# Patient Record
Sex: Male | Born: 1970 | Race: White | Hispanic: No | Marital: Single | Smoking: Never smoker
Health system: Southern US, Community
[De-identification: ages and names within clinical notes are randomized; demographics above are authoritative.]

## PROBLEM LIST (undated history)

## (undated) ENCOUNTER — Emergency Department (HOSPITAL_BASED_OUTPATIENT_CLINIC_OR_DEPARTMENT_OTHER)

## (undated) DIAGNOSIS — G8929 Other chronic pain: Secondary | ICD-10-CM

## (undated) DIAGNOSIS — I10 Essential (primary) hypertension: Secondary | ICD-10-CM

## (undated) DIAGNOSIS — J939 Pneumothorax, unspecified: Secondary | ICD-10-CM

## (undated) HISTORY — PX: SHOULDER SURGERY: SHX246

## (undated) HISTORY — DX: Pneumothorax, unspecified: J93.9

## (undated) HISTORY — DX: Other chronic pain: G89.29

## (undated) HISTORY — DX: Essential (primary) hypertension: I10

---

## 2017-10-10 DIAGNOSIS — M79609 Pain in unspecified limb: Secondary | ICD-10-CM

## 2017-10-10 DIAGNOSIS — R29898 Other symptoms and signs involving the musculoskeletal system: Secondary | ICD-10-CM

## 2017-10-10 DIAGNOSIS — G8929 Other chronic pain: Secondary | ICD-10-CM | POA: Insufficient documentation

## 2017-10-10 HISTORY — DX: Other symptoms and signs involving the musculoskeletal system: R29.898

## 2017-10-10 HISTORY — DX: Pain in unspecified limb: M79.609

## 2018-04-15 DIAGNOSIS — E782 Mixed hyperlipidemia: Secondary | ICD-10-CM | POA: Insufficient documentation

## 2018-04-15 DIAGNOSIS — I10 Essential (primary) hypertension: Secondary | ICD-10-CM | POA: Insufficient documentation

## 2018-04-15 HISTORY — DX: Essential (primary) hypertension: I10

## 2018-04-15 HISTORY — DX: Mixed hyperlipidemia: E78.2

## 2018-05-15 DIAGNOSIS — M48061 Spinal stenosis, lumbar region without neurogenic claudication: Secondary | ICD-10-CM

## 2018-05-15 HISTORY — DX: Spinal stenosis, lumbar region without neurogenic claudication: M48.061

## 2018-06-11 DIAGNOSIS — Z6831 Body mass index (BMI) 31.0-31.9, adult: Secondary | ICD-10-CM | POA: Insufficient documentation

## 2018-06-11 HISTORY — DX: Body mass index (BMI) 31.0-31.9, adult: Z68.31

## 2018-07-15 ENCOUNTER — Other Ambulatory Visit: Payer: Self-pay | Admitting: Neurosurgery

## 2018-07-17 ENCOUNTER — Other Ambulatory Visit: Payer: Self-pay | Admitting: *Deleted

## 2018-08-05 ENCOUNTER — Telehealth (HOSPITAL_COMMUNITY): Payer: Self-pay | Admitting: Rehabilitation

## 2018-08-05 NOTE — Telephone Encounter (Signed)

## 2018-08-06 ENCOUNTER — Other Ambulatory Visit: Payer: Self-pay

## 2018-08-06 ENCOUNTER — Ambulatory Visit (INDEPENDENT_AMBULATORY_CARE_PROVIDER_SITE_OTHER): Payer: Medicaid Other | Admitting: Vascular Surgery

## 2018-08-06 ENCOUNTER — Encounter: Payer: Self-pay | Admitting: Vascular Surgery

## 2018-08-06 VITALS — BP 116/76 | HR 86 | Temp 97.3°F | Resp 20 | Ht 67.0 in | Wt 202.5 lb

## 2018-08-06 DIAGNOSIS — M5137 Other intervertebral disc degeneration, lumbosacral region: Secondary | ICD-10-CM

## 2018-08-06 DIAGNOSIS — M5136 Other intervertebral disc degeneration, lumbar region: Secondary | ICD-10-CM

## 2018-08-06 NOTE — Progress Notes (Addendum)
REASON FOR CONSULT:    Preoperative evaluation prior to ALIF.  The consult is requested by Dr. Jordan Likes.  ASSESSMENT & PLAN:   DEGENERATIVE DISC DISEASE L5-S1: Patient appears to be a good candidate for anterior retroperitoneal exposure of L5-S1. I have reviewed our role in exposure of the spine in order to allow anterior lumbar interbody fusion at the appropriate levels. We have discussed the potential complications of surgery, including but not limited to, arterial or venous injury, thrombosis, or bleeding. We have also discussed the potential risks of wound healing problems, the development of a hernia, nerve injury, leg swelling, or other unpredictable medical problems. I've also explained that for the L5-S1 level there is a small risk of retrograde ejaculation. All the patient's questions were answered and they are agreeable to proceed.  Jonathan Ferrari, MD, FACS Beeper [phone] Office: [phone]   HPI:   [name] is a pleasant 48 y.o. male, with a long history of back pain.  This pain is become significantly disabling and is limiting his ability to walk.  He has significant pain in the left leg with burning pain along the lateral aspect of his left thigh.  He is failed conservative treatment and now presents for anterior lumbar interbody fusion with Dr. Jordan Likes.  We have been asked to provide anterior retroperitoneal exposure.  He denies any previous history of abdominal surgery.  His only real risk factor for peripheral vascular disease is a history of hypertension.  He denies any history of claudication, rest pain, or nonhealing ulcers.  He is not a smoker.  I did review the records that were sent from the referring office.  The patient has been followed with back pain.  He had exhausted all efforts at conservative management.  He has a markedly degenerative L5-S1 disc space with disc herniation.  He has radicular symptoms which are causing him to fall.  Past Medical  History:  Diagnosis Date  . Chronic back pain   . Hypertension   . Pneumothorax    right    History reviewed. No pertinent family history.  SOCIAL HISTORY: Social History   Socioeconomic History  . Marital status: Single    Spouse name: Not on file  . Number of children: Not on file  . Years of education: Not on file  . Highest education level: Not on file  Occupational History  . Not on file  Social Needs  . Financial resource strain: Not on file  . Food insecurity    Worry: Not on file    Inability: Not on file  . Transportation needs    Medical: Not on file    Non-medical: Not on file  Tobacco Use  . Smoking status: Never Smoker  . Smokeless tobacco: Never Used  Substance and Sexual Activity  . Alcohol use: Yes    Comment: occasional  . Drug use: Yes    Types: Hydrocodone  . Sexual activity: Not on file  Lifestyle  . Physical activity    Days per week: Not on file    Minutes per session: Not on file  . Stress: Not on file  Relationships  . Social Musician on phone: Not on file    Gets together: Not on file    Attends religious service: Not on file    Active member of club or organization: Not on file    Attends meetings of clubs or organizations: Not on file    Relationship status: Not on  file  . Intimate partner violence    Fear of current or ex partner: Not on file    Emotionally abused: Not on file    Physically abused: Not on file    Forced sexual activity: Not on file  Other Topics Concern  . Not on file  Social History Narrative  . Not on file    No Known Allergies  Current Outpatient Medications  Medication Sig Dispense Refill  . HYDROcodone-acetaminophen (NORCO/VICODIN) 5-325 MG tablet Take 1 tablet by mouth every 6 (six) hours as needed for pain.    . lansoprazole (PREVACID) 15 MG capsule Take 15 mg by mouth every morning.     . lisinopril-hydrochlorothiazide (ZESTORETIC) 20-12.5 MG tablet Take 1 tablet by mouth daily.      No current facility-administered medications for this visit.     REVIEW OF SYSTEMS:  [X] denotes positive finding, [ ] denotes negative finding Cardiac  Comments:  Chest pain or chest pressure:    Shortness of breath upon exertion:    Short of breath when lying flat:    Irregular heart rhythm:        Vascular    Pain in calf, thigh, or hip brought on by ambulation:    Pain in feet at night that wakes you up from your sleep:     Blood clot in your veins:    Leg swelling:         Pulmonary    Oxygen at home:    Productive cough:     Wheezing:         Neurologic    Sudden weakness in arms or legs:     Sudden numbness in arms or legs:     Sudden onset of difficulty speaking or slurred speech:    Temporary loss of vision in one eye:     Problems with dizziness:         Gastrointestinal    Blood in stool:     Vomited blood:         Genitourinary    Burning when urinating:     Blood in urine:        Psychiatric    Major depression:         Hematologic    Bleeding problems:    Problems with blood clotting too easily:        Skin    Rashes or ulcers:        Constitutional    Fever or chills:     PHYSICAL EXAM:   Vitals:   08/06/18 0926  BP: 116/76  Pulse: 86  Resp: 20  Temp: (!) 97.3 F (36.3 C)  SpO2: 97%  Weight: 202 lb 8 oz (91.9 kg)  Height: 5' 7" (1.702 m)    GENERAL: The patient is a well-nourished male, in no acute distress. The vital signs are documented above. CARDIAC: There is a regular rate and rhythm.  VASCULAR: I do not detect carotid bruits. He has palpable femoral and posterior tibial pulses bilaterally. He has no lower extremity swelling. PULMONARY: There is good air exchange bilaterally without wheezing or rales. ABDOMEN: Soft and non-tender with normal pitched bowel sounds.  MUSCULOSKELETAL: There are no major deformities or cyanosis. NEUROLOGIC: He has weakness in the left lower extremity and is difficult for him to even get up on the  table. SKIN: There are no ulcers or rashes noted. PSYCHIATRIC: The patient has a normal affect.  DATA:    CT LUMBAR SPINE: I   did review the images of his CT lumbar spine that was done at [facility].  I do not see any complicating features as related to anterior retroperitoneal exposure of L5-S1.

## 2018-08-06 NOTE — H&P (View-Only) (Signed)
REASON FOR CONSULT:    Preoperative evaluation prior to ALIF.  The consult is requested by Dr. Jordan Likes.  ASSESSMENT & PLAN:   DEGENERATIVE DISC DISEASE L5-S1: Patient appears to be a good candidate for anterior retroperitoneal exposure of L5-S1. I have reviewed our role in exposure of the spine in order to allow anterior lumbar interbody fusion at the appropriate levels. We have discussed the potential complications of surgery, including but not limited to, arterial or venous injury, thrombosis, or bleeding. We have also discussed the potential risks of wound healing problems, the development of a hernia, nerve injury, leg swelling, or other unpredictable medical problems. I've also explained that for the L5-S1 level there is a small risk of retrograde ejaculation. All the patient's questions were answered and they are agreeable to proceed.  Jonathan Ferrari, MD, FACS Beeper [phone] Office: [phone]   HPI:   [name] is a pleasant 48 y.o. male, with a long history of back pain.  This pain is become significantly disabling and is limiting his ability to walk.  He has significant pain in the left leg with burning pain along the lateral aspect of his left thigh.  He is failed conservative treatment and now presents for anterior lumbar interbody fusion with Dr. Jordan Likes.  We have been asked to provide anterior retroperitoneal exposure.  He denies any previous history of abdominal surgery.  His only real risk factor for peripheral vascular disease is a history of hypertension.  He denies any history of claudication, rest pain, or nonhealing ulcers.  He is not a smoker.  I did review the records that were sent from the referring office.  The patient has been followed with back pain.  He had exhausted all efforts at conservative management.  He has a markedly degenerative L5-S1 disc space with disc herniation.  He has radicular symptoms which are causing him to fall.  Past Medical  History:  Diagnosis Date  . Chronic back pain   . Hypertension   . Pneumothorax    right    History reviewed. No pertinent family history.  SOCIAL HISTORY: Social History   Socioeconomic History  . Marital status: Single    Spouse name: Not on file  . Number of children: Not on file  . Years of education: Not on file  . Highest education level: Not on file  Occupational History  . Not on file  Social Needs  . Financial resource strain: Not on file  . Food insecurity    Worry: Not on file    Inability: Not on file  . Transportation needs    Medical: Not on file    Non-medical: Not on file  Tobacco Use  . Smoking status: Never Smoker  . Smokeless tobacco: Never Used  Substance and Sexual Activity  . Alcohol use: Yes    Comment: occasional  . Drug use: Yes    Types: Hydrocodone  . Sexual activity: Not on file  Lifestyle  . Physical activity    Days per week: Not on file    Minutes per session: Not on file  . Stress: Not on file  Relationships  . Social Musician on phone: Not on file    Gets together: Not on file    Attends religious service: Not on file    Active member of club or organization: Not on file    Attends meetings of clubs or organizations: Not on file    Relationship status: Not on  file  . Intimate partner violence    Fear of current or ex partner: Not on file    Emotionally abused: Not on file    Physically abused: Not on file    Forced sexual activity: Not on file  Other Topics Concern  . Not on file  Social History Narrative  . Not on file    No Known Allergies  Current Outpatient Medications  Medication Sig Dispense Refill  . HYDROcodone-acetaminophen (NORCO/VICODIN) 5-325 MG tablet Take 1 tablet by mouth every 6 (six) hours as needed for pain.    Marland Kitchen. lansoprazole (PREVACID) 15 MG capsule Take 15 mg by mouth every morning.     Marland Kitchen. lisinopril-hydrochlorothiazide (ZESTORETIC) 20-12.5 MG tablet Take 1 tablet by mouth daily.      No current facility-administered medications for this visit.     REVIEW OF SYSTEMS:  [X]  denotes positive finding, [ ]  denotes negative finding Cardiac  Comments:  Chest pain or chest pressure:    Shortness of breath upon exertion:    Short of breath when lying flat:    Irregular heart rhythm:        Vascular    Pain in calf, thigh, or hip brought on by ambulation:    Pain in feet at night that wakes you up from your sleep:     Blood clot in your veins:    Leg swelling:         Pulmonary    Oxygen at home:    Productive cough:     Wheezing:         Neurologic    Sudden weakness in arms or legs:     Sudden numbness in arms or legs:     Sudden onset of difficulty speaking or slurred speech:    Temporary loss of vision in one eye:     Problems with dizziness:         Gastrointestinal    Blood in stool:     Vomited blood:         Genitourinary    Burning when urinating:     Blood in urine:        Psychiatric    Major depression:         Hematologic    Bleeding problems:    Problems with blood clotting too easily:        Skin    Rashes or ulcers:        Constitutional    Fever or chills:     PHYSICAL EXAM:   Vitals:   08/06/18 0926  BP: 116/76  Pulse: 86  Resp: 20  Temp: (!) 97.3 F (36.3 C)  SpO2: 97%  Weight: 202 lb 8 oz (91.9 kg)  Height: 5\' 7"  (1.702 m)    GENERAL: The patient is a well-nourished male, in no acute distress. The vital signs are documented above. CARDIAC: There is a regular rate and rhythm.  VASCULAR: I do not detect carotid bruits. He has palpable femoral and posterior tibial pulses bilaterally. He has no lower extremity swelling. PULMONARY: There is good air exchange bilaterally without wheezing or rales. ABDOMEN: Soft and non-tender with normal pitched bowel sounds.  MUSCULOSKELETAL: There are no major deformities or cyanosis. NEUROLOGIC: He has weakness in the left lower extremity and is difficult for him to even get up on the  table. SKIN: There are no ulcers or rashes noted. PSYCHIATRIC: The patient has a normal affect.  DATA:    CT LUMBAR SPINE: I  did review the images of his CT lumbar spine that was done at [facility].  I do not see any complicating features as related to anterior retroperitoneal exposure of L5-S1.

## 2018-08-07 NOTE — Pre-Procedure Instructions (Signed)
[facility], [address] AT [address] [place] [address] Phone: [phone] Fax: [phone]      Your procedure is scheduled on 08-12-18  Report to [facility] Main Entrance "A" at 0530 A.M., and check in at the Admitting office.  Call this number if you have problems the morning of surgery:  [phone]  Call [phone] if you have any questions prior to your surgery date Monday-Friday 8am-4pm    Remember:  Do not eat or drink after midnight the night before your surgery  Take these medicines the morning of surgery with A SIP OF WATER : lansoprazole (PREVACID) HYDROcodone-acetaminophen (NORCO/VICODIN)as needed  7 days prior to surgery STOP taking any Aspirin (unless otherwise instructed by your surgeon), Aleve, Naproxen, Ibuprofen, Motrin, Advil, Goody's, BC's, all herbal medications, fish oil, and all vitamins.    The Morning of Surgery  Do not wear jewelry.  Do not wear lotions, powders, or perfumes/colognes, or deodorant    Men may shave face and neck.  Do not bring valuables to the hospital.  [facility] is not responsible for any belongings or valuables.  If you are a smoker, DO NOT Smoke 24 hours prior to surgery IF you wear a CPAP at night please bring your mask, tubing, and machine the morning of surgery   Remember that you must have someone to transport you home after your surgery, and remain with you for 24 hours if you are discharged the same day.   Contacts, glasses, hearing aids, dentures or bridgework may not be worn into surgery.    Leave your suitcase in the car.  After surgery it may be brought to your room.  For patients admitted to the hospital, discharge time will be determined by your treatment team.  Patients discharged the day of surgery will not be allowed to drive home.    Special instructions:   [facility]- Preparing For Surgery  Before surgery, you  can play an important role. Because skin is not sterile, your skin needs to be as free of germs as possible. You can reduce the number of germs on your skin by washing with CHG (chlorahexidine gluconate) Soap before surgery.  CHG is an antiseptic cleaner which kills germs and bonds with the skin to continue killing germs even after washing.    Oral Hygiene is also important to reduce your risk of infection.  Remember - BRUSH YOUR TEETH THE MORNING OF SURGERY WITH YOUR REGULAR TOOTHPASTE  Please do not use if you have an allergy to CHG or antibacterial soaps. If your skin becomes reddened/irritated stop using the CHG.  Do not shave (including legs and underarms) for at least 48 hours prior to first CHG shower. It is OK to shave your face.  Please follow these instructions carefully.   1. Shower the NIGHT BEFORE SURGERY and the MORNING OF SURGERY with CHG Soap.   2. If you chose to wash your hair, wash your hair first as usual with your normal shampoo.  3. After you shampoo, rinse your hair and body thoroughly to remove the shampoo.  4. Use CHG as you would any other liquid soap. You can apply CHG directly to the skin and wash gently with a scrungie or a clean washcloth.   5. Apply the CHG Soap to your body ONLY FROM THE NECK DOWN.  Do not use on open wounds or open sores. Avoid contact  with your eyes, ears, mouth and genitals (private parts). Wash Face and genitals (private parts)  with your normal soap.   6. Wash thoroughly, paying special attention to the area where your surgery will be performed.  7. Thoroughly rinse your body with warm water from the neck down.  8. DO NOT shower/wash with your normal soap after using and rinsing off the CHG Soap.  9. Pat yourself dry with a CLEAN TOWEL.  10. Wear CLEAN PAJAMAS to bed the night before surgery, wear comfortable clothes the morning of surgery  11. Place CLEAN SHEETS on your bed the night of your first shower and DO NOT SLEEP WITH  PETS.  Day of Surgery:  Do not apply any deodorants/lotions. Please shower the morning of surgery with the CHG soap  Please wear clean clothes to the hospital/surgery center.   Remember to brush your teeth WITH YOUR REGULAR TOOTHPASTE.   Please read over the following fact sheets that you were given.

## 2018-08-08 ENCOUNTER — Encounter (HOSPITAL_COMMUNITY)
Admission: RE | Admit: 2018-08-08 | Discharge: 2018-08-08 | Disposition: A | Discharge status: Home / Self Care | Payer: Medicaid Other | Source: Ambulatory Visit | Attending: Neurosurgery | Admitting: Neurosurgery

## 2018-08-08 ENCOUNTER — Other Ambulatory Visit: Payer: Self-pay

## 2018-08-08 ENCOUNTER — Encounter (HOSPITAL_COMMUNITY): Payer: Self-pay

## 2018-08-08 ENCOUNTER — Other Ambulatory Visit (HOSPITAL_COMMUNITY)
Admission: RE | Admit: 2018-08-08 | Discharge: 2018-08-08 | Disposition: A | Discharge status: Home / Self Care | Payer: Medicaid Other | Source: Ambulatory Visit | Attending: Neurosurgery | Admitting: Neurosurgery

## 2018-08-08 DIAGNOSIS — Z20828 Contact with and (suspected) exposure to other viral communicable diseases: Secondary | ICD-10-CM | POA: Diagnosis not present

## 2018-08-08 DIAGNOSIS — Z01818 Encounter for other preprocedural examination: Secondary | ICD-10-CM | POA: Diagnosis not present

## 2018-08-08 LAB — ABO/RH: ABO/RH(D): B POS

## 2018-08-08 LAB — CBC WITH DIFFERENTIAL/PLATELET
Abs Immature Granulocytes: 0.01 10*3/uL (ref 0.00–0.07)
Basophils Absolute: 0 10*3/uL (ref 0.0–0.1)
Basophils Relative: 1 %
Eosinophils Absolute: 0.1 10*3/uL (ref 0.0–0.5)
Eosinophils Relative: 2 %
HCT: 42.8 % (ref 39.0–52.0)
Hemoglobin: 15.2 g/dL (ref 13.0–17.0)
Immature Granulocytes: 0 %
Lymphocytes Relative: 27 %
Lymphs Abs: 1.2 10*3/uL (ref 0.7–4.0)
MCH: 32.8 pg (ref 26.0–34.0)
MCHC: 35.5 g/dL (ref 30.0–36.0)
MCV: 92.4 fL (ref 80.0–100.0)
Monocytes Absolute: 0.4 10*3/uL (ref 0.1–1.0)
Monocytes Relative: 8 %
Neutro Abs: 2.7 10*3/uL (ref 1.7–7.7)
Neutrophils Relative %: 62 %
Platelets: 227 10*3/uL (ref 150–400)
RBC: 4.63 MIL/uL (ref 4.22–5.81)
RDW: 12.2 % (ref 11.5–15.5)
WBC: 4.3 10*3/uL (ref 4.0–10.5)
nRBC: 0 % (ref 0.0–0.2)

## 2018-08-08 LAB — BASIC METABOLIC PANEL
Anion gap: 12 (ref 5–15)
BUN: 13 mg/dL (ref 6–20)
CO2: 23 mmol/L (ref 22–32)
Calcium: 9.4 mg/dL (ref 8.9–10.3)
Chloride: 101 mmol/L (ref 98–111)
Creatinine, Ser: 1.16 mg/dL (ref 0.61–1.24)
GFR calc Af Amer: >60 mL/min (ref >60)
GFR calc non Af Amer: >60 mL/min (ref >60)
Glucose, Bld: 98 mg/dL (ref 70–99)
Potassium: 3.7 mmol/L (ref 3.5–5.1)
Sodium: 136 mmol/L (ref 135–145)

## 2018-08-08 LAB — TYPE AND SCREEN
ABO/RH(D): B POS
Antibody Screen: NEGATIVE

## 2018-08-08 LAB — SURGICAL PCR SCREEN
MRSA, PCR: NEGATIVE
Staphylococcus aureus: POSITIVE — AB

## 2018-08-08 NOTE — Progress Notes (Signed)
  Coronavirus Screening Scheduled for COVID test today. Have you experienced the following symptoms:  Cough yes/no: No Fever (>100.88F)  yes/no: No Runny nose yes/no: No Sore throat yes/no: No Difficulty breathing/shortness of breath  yes/no: No Have you or a family member traveled in the last 14 days and where? yes/no: No  PCP - Dr Garlon Hatchet  [facility]  Cardiologist - denies  Chest x-ray - NA  EKG - today  Stress Test - denies  ECHO - denies  Cardiac Cath - denies  AICD-denies PM-denies LOOP-denies  Sleep Study - N CPAP - NA  LABS-CBC diff, T/S,BMP  ERAS-NA  Not a diabetic Fasting Blood Sugar -  Checks Blood Sugar _____ times a day  Anesthesia-N  Pt denies having chest pain, sob, or fever at this time. All instructions explained to the pt, with a verbal understanding of the material. Pt agrees to go over the instructions while at home for a better understanding. Pt also instructed to self quarantine after being tested for COVID-19. The opportunity to ask questions was provided.

## 2018-08-09 LAB — SARS CORONAVIRUS 2 (TAT 6-24 HRS): SARS Coronavirus 2: NEGATIVE

## 2018-08-11 ENCOUNTER — Encounter (HOSPITAL_COMMUNITY): Payer: Self-pay | Admitting: Anesthesiology

## 2018-08-11 NOTE — Anesthesia Preprocedure Evaluation (Addendum)
Anesthesia Evaluation  Patient identified by MRN, date of birth, ID band Patient awake    Reviewed: Allergy & Precautions, NPO status , Patient's Chart, lab work & pertinent test results  Airway Mallampati: II  TM Distance: >3 FB Neck ROM: Full    Dental no notable dental hx. (+) Teeth Intact   Pulmonary neg pulmonary ROS,    Pulmonary exam normal breath sounds clear to auscultation       Cardiovascular hypertension, Pt. on medications Normal cardiovascular exam Rhythm:Regular Rate:Normal  EKG 08/08/2018 Normal   Neuro/Psych negative neurological ROS  negative psych ROS   GI/Hepatic Neg liver ROS, GERD  Controlled and Medicated,  Endo/Other  negative endocrine ROS  Renal/GU negative Renal ROS  negative genitourinary   Musculoskeletal DDD L5-S1   Abdominal (+) + obese,   Peds  Hematology negative hematology ROS (+)   Anesthesia Other Findings   Reproductive/Obstetrics                            Anesthesia Physical Anesthesia Plan  ASA: II  Anesthesia Plan: General   Post-op Pain Management:    Induction: Intravenous  PONV Risk Score and Plan: 4 or greater and Scopolamine patch - Pre-op, Midazolam, Dexamethasone, Ondansetron and Treatment may vary due to age or medical condition  Airway Management Planned: Oral ETT  Additional Equipment:   Intra-op Plan:   Post-operative Plan: Extubation in OR  Informed Consent: I have reviewed the patients History and Physical, chart, labs and discussed the procedure including the risks, benefits and alternatives for the proposed anesthesia with the patient or authorized representative who has indicated his/her understanding and acceptance.     Dental advisory given  Plan Discussed with: CRNA and Surgeon  Anesthesia Plan Comments:        Anesthesia Quick Evaluation

## 2018-08-12 ENCOUNTER — Inpatient Hospital Stay (HOSPITAL_COMMUNITY): Payer: Medicaid Other | Admitting: Anesthesiology

## 2018-08-12 ENCOUNTER — Encounter (HOSPITAL_COMMUNITY)
Admission: RE | Disposition: A | Discharge status: Home / Self Care | Payer: Self-pay | Source: Home / Self Care | Attending: Neurosurgery

## 2018-08-12 ENCOUNTER — Inpatient Hospital Stay (HOSPITAL_COMMUNITY): Payer: Medicaid Other

## 2018-08-12 ENCOUNTER — Other Ambulatory Visit: Payer: Self-pay

## 2018-08-12 ENCOUNTER — Encounter (HOSPITAL_COMMUNITY): Payer: Self-pay

## 2018-08-12 ENCOUNTER — Inpatient Hospital Stay (HOSPITAL_COMMUNITY)
Admission: RE | Admit: 2018-08-12 | Discharge: 2018-08-13 | DRG: 460 | Disposition: A | Discharge status: Home / Self Care | Payer: Medicaid Other | Attending: Neurosurgery | Admitting: Neurosurgery

## 2018-08-12 DIAGNOSIS — M4807 Spinal stenosis, lumbosacral region: Secondary | ICD-10-CM | POA: Diagnosis present

## 2018-08-12 DIAGNOSIS — M51379 Other intervertebral disc degeneration, lumbosacral region without mention of lumbar back pain or lower extremity pain: Secondary | ICD-10-CM | POA: Diagnosis present

## 2018-08-12 DIAGNOSIS — I10 Essential (primary) hypertension: Secondary | ICD-10-CM | POA: Diagnosis present

## 2018-08-12 DIAGNOSIS — G8929 Other chronic pain: Secondary | ICD-10-CM | POA: Diagnosis present

## 2018-08-12 DIAGNOSIS — M5137 Other intervertebral disc degeneration, lumbosacral region: Principal | ICD-10-CM | POA: Diagnosis present

## 2018-08-12 DIAGNOSIS — M5117 Intervertebral disc disorders with radiculopathy, lumbosacral region: Secondary | ICD-10-CM | POA: Diagnosis not present

## 2018-08-12 DIAGNOSIS — M4317 Spondylolisthesis, lumbosacral region: Secondary | ICD-10-CM | POA: Diagnosis present

## 2018-08-12 DIAGNOSIS — K219 Gastro-esophageal reflux disease without esophagitis: Secondary | ICD-10-CM | POA: Diagnosis present

## 2018-08-12 DIAGNOSIS — Z419 Encounter for procedure for purposes other than remedying health state, unspecified: Secondary | ICD-10-CM

## 2018-08-12 DIAGNOSIS — Z79899 Other long term (current) drug therapy: Secondary | ICD-10-CM

## 2018-08-12 HISTORY — PX: ANTERIOR LUMBAR FUSION: SHX1170

## 2018-08-12 HISTORY — PX: ABDOMINAL EXPOSURE: SHX5708

## 2018-08-12 HISTORY — DX: Other intervertebral disc degeneration, lumbosacral region without mention of lumbar back pain or lower extremity pain: M51.379

## 2018-08-12 SURGERY — ANTERIOR LUMBAR FUSION 1 LEVEL
Anesthesia: General | Site: Abdomen

## 2018-08-12 MED ORDER — HYDROCHLOROTHIAZIDE 12.5 MG PO CAPS
12.5000 mg | ORAL_CAPSULE | Freq: Every day | ORAL | Status: DC
  Administered 2018-08-13: 09:00:00 12.5 mg via ORAL
  Filled 2018-08-12 (×2): qty 1

## 2018-08-12 MED ORDER — MENTHOL 3 MG MT LOZG
1.0000 | LOZENGE | OROMUCOSAL | Status: DC | PRN
  Filled 2018-08-12: qty 9

## 2018-08-12 MED ORDER — SODIUM CHLORIDE 0.9% FLUSH: 3.0000 mL | INTRAVENOUS | Status: DC | PRN

## 2018-08-12 MED ORDER — CHLORHEXIDINE GLUCONATE 4 % EX LIQD: 60.0000 mL | Freq: Once | CUTANEOUS | Status: DC

## 2018-08-12 MED ORDER — FENTANYL CITRATE (PF) 250 MCG/5ML IJ SOLN
INTRAMUSCULAR | Status: AC
  Filled 2018-08-12: qty 5

## 2018-08-12 MED ORDER — DEXAMETHASONE SODIUM PHOSPHATE 10 MG/ML IJ SOLN
10.0000 mg | Freq: Once | INTRAMUSCULAR | Status: AC
  Administered 2018-08-12: 10 mg via INTRAVENOUS
  Filled 2018-08-12: qty 1

## 2018-08-12 MED ORDER — DIAZEPAM 5 MG PO TABS
5.0000 mg | ORAL_TABLET | Freq: Four times a day (QID) | ORAL | Status: DC | PRN
  Administered 2018-08-12: 10:00:00 10 mg via ORAL
  Administered 2018-08-12 – 2018-08-13 (×3): 5 mg via ORAL
  Filled 2018-08-12 (×2): qty 1
  Filled 2018-08-12: qty 2

## 2018-08-12 MED ORDER — LACTATED RINGERS IV SOLN
INTRAVENOUS | Status: DC | PRN
  Administered 2018-08-12: 07:00:00 via INTRAVENOUS

## 2018-08-12 MED ORDER — HYDROMORPHONE HCL 1 MG/ML IJ SOLN
0.2500 mg | INTRAMUSCULAR | Status: DC | PRN
  Administered 2018-08-12 (×3): 0.5 mg via INTRAVENOUS

## 2018-08-12 MED ORDER — BISACODYL 10 MG RE SUPP: 10.0000 mg | Freq: Every day | RECTAL | Status: DC | PRN

## 2018-08-12 MED ORDER — LIDOCAINE 2% (20 MG/ML) 5 ML SYRINGE
INTRAMUSCULAR | Status: DC | PRN
  Administered 2018-08-12: 80 mg via INTRAVENOUS

## 2018-08-12 MED ORDER — SODIUM CHLORIDE 0.9 % IV SOLN: INTRAVENOUS | Status: DC | PRN

## 2018-08-12 MED ORDER — SUGAMMADEX SODIUM 200 MG/2ML IV SOLN
INTRAVENOUS | Status: DC | PRN
  Administered 2018-08-12: 200 mg via INTRAVENOUS

## 2018-08-12 MED ORDER — THROMBIN 5000 UNITS EX SOLR
CUTANEOUS | Status: AC
  Filled 2018-08-12: qty 5000

## 2018-08-12 MED ORDER — LISINOPRIL 20 MG PO TABS
20.0000 mg | ORAL_TABLET | Freq: Every day | ORAL | Status: DC
  Administered 2018-08-13: 09:00:00 20 mg via ORAL
  Filled 2018-08-12 (×2): qty 1

## 2018-08-12 MED ORDER — MEPERIDINE HCL 25 MG/ML IJ SOLN: 6.2500 mg | INTRAMUSCULAR | Status: DC | PRN

## 2018-08-12 MED ORDER — PROPOFOL 10 MG/ML IV BOLUS
INTRAVENOUS | Status: AC
  Filled 2018-08-12: qty 20

## 2018-08-12 MED ORDER — ONDANSETRON HCL 4 MG/2ML IJ SOLN
INTRAMUSCULAR | Status: DC | PRN
  Administered 2018-08-12: 4 mg via INTRAVENOUS

## 2018-08-12 MED ORDER — THROMBIN 20000 UNITS EX SOLR
CUTANEOUS | Status: DC | PRN
  Administered 2018-08-12: 09:00:00 20 mL via TOPICAL

## 2018-08-12 MED ORDER — MIDAZOLAM HCL 2 MG/2ML IJ SOLN
INTRAMUSCULAR | Status: AC
  Filled 2018-08-12: qty 2

## 2018-08-12 MED ORDER — PHENYLEPHRINE 40 MCG/ML (10ML) SYRINGE FOR IV PUSH (FOR BLOOD PRESSURE SUPPORT)
PREFILLED_SYRINGE | INTRAVENOUS | Status: DC | PRN
  Administered 2018-08-12: 80 ug via INTRAVENOUS

## 2018-08-12 MED ORDER — THROMBIN 20000 UNITS EX SOLR
CUTANEOUS | Status: AC
  Filled 2018-08-12: qty 20000

## 2018-08-12 MED ORDER — ACETAMINOPHEN 650 MG RE SUPP: 650.0000 mg | RECTAL | Status: DC | PRN

## 2018-08-12 MED ORDER — BUPIVACAINE HCL (PF) 0.25 % IJ SOLN
INTRAMUSCULAR | Status: AC
  Filled 2018-08-12: qty 30

## 2018-08-12 MED ORDER — LISINOPRIL-HYDROCHLOROTHIAZIDE 20-12.5 MG PO TABS: 1.0000 | ORAL_TABLET | Freq: Every day | ORAL | Status: DC

## 2018-08-12 MED ORDER — PHENOL 1.4 % MT LIQD: 1.0000 | OROMUCOSAL | Status: DC | PRN

## 2018-08-12 MED ORDER — SCOPOLAMINE 1 MG/3DAYS TD PT72
MEDICATED_PATCH | TRANSDERMAL | Status: DC | PRN
  Administered 2018-08-12: 1 via TRANSDERMAL

## 2018-08-12 MED ORDER — OXYCODONE HCL 5 MG PO TABS
ORAL_TABLET | ORAL | Status: AC
  Filled 2018-08-12: qty 2

## 2018-08-12 MED ORDER — PANTOPRAZOLE SODIUM 20 MG PO TBEC
20.0000 mg | DELAYED_RELEASE_TABLET | Freq: Every day | ORAL | Status: DC
  Administered 2018-08-12 – 2018-08-13 (×2): 20 mg via ORAL
  Filled 2018-08-12 (×2): qty 1

## 2018-08-12 MED ORDER — CEFAZOLIN SODIUM-DEXTROSE 1-4 GM/50ML-% IV SOLN
1.0000 g | Freq: Three times a day (TID) | INTRAVENOUS | Status: AC
  Administered 2018-08-12: 1 g via INTRAVENOUS
  Filled 2018-08-12: qty 50

## 2018-08-12 MED ORDER — ONDANSETRON HCL 4 MG PO TABS: 4.0000 mg | ORAL_TABLET | Freq: Four times a day (QID) | ORAL | Status: DC | PRN

## 2018-08-12 MED ORDER — SUCCINYLCHOLINE CHLORIDE 200 MG/10ML IV SOSY
PREFILLED_SYRINGE | INTRAVENOUS | Status: DC | PRN
  Administered 2018-08-12: 100 mg via INTRAVENOUS

## 2018-08-12 MED ORDER — SODIUM CHLORIDE 0.9 % IV SOLN
250.0000 mL | INTRAVENOUS | Status: DC
  Administered 2018-08-12: 11:00:00 25 mL via INTRAVENOUS

## 2018-08-12 MED ORDER — POLYETHYLENE GLYCOL 3350 17 G PO PACK: 17.0000 g | PACK | Freq: Every day | ORAL | Status: DC | PRN

## 2018-08-12 MED ORDER — HYDROMORPHONE HCL 1 MG/ML IJ SOLN
INTRAMUSCULAR | Status: AC
  Filled 2018-08-12: qty 1

## 2018-08-12 MED ORDER — HYDROCODONE-ACETAMINOPHEN 10-325 MG PO TABS: 1.0000 | ORAL_TABLET | ORAL | Status: DC | PRN

## 2018-08-12 MED ORDER — FLEET ENEMA 7-19 GM/118ML RE ENEM: 1.0000 | ENEMA | Freq: Once | RECTAL | Status: DC | PRN

## 2018-08-12 MED ORDER — SODIUM CHLORIDE 0.9 % IV SOLN
INTRAVENOUS | Status: DC | PRN
  Administered 2018-08-12: 30 ug/min via INTRAVENOUS

## 2018-08-12 MED ORDER — ROCURONIUM BROMIDE 10 MG/ML (PF) SYRINGE
PREFILLED_SYRINGE | INTRAVENOUS | Status: DC | PRN
  Administered 2018-08-12: 20 mg via INTRAVENOUS
  Administered 2018-08-12: 50 mg via INTRAVENOUS

## 2018-08-12 MED ORDER — OXYCODONE HCL 5 MG PO TABS
10.0000 mg | ORAL_TABLET | ORAL | Status: DC | PRN
  Administered 2018-08-12 – 2018-08-13 (×7): 10 mg via ORAL
  Filled 2018-08-12 (×6): qty 2

## 2018-08-12 MED ORDER — FENTANYL CITRATE (PF) 250 MCG/5ML IJ SOLN
INTRAMUSCULAR | Status: DC | PRN
  Administered 2018-08-12 (×3): 50 ug via INTRAVENOUS
  Administered 2018-08-12: 25 ug via INTRAVENOUS
  Administered 2018-08-12: 50 ug via INTRAVENOUS
  Administered 2018-08-12: 25 ug via INTRAVENOUS

## 2018-08-12 MED ORDER — ACETAMINOPHEN 325 MG PO TABS
650.0000 mg | ORAL_TABLET | ORAL | Status: DC | PRN
  Administered 2018-08-12 – 2018-08-13 (×2): 650 mg via ORAL
  Filled 2018-08-12 (×2): qty 2

## 2018-08-12 MED ORDER — SCOPOLAMINE 1 MG/3DAYS TD PT72
MEDICATED_PATCH | TRANSDERMAL | Status: AC
  Filled 2018-08-12: qty 1

## 2018-08-12 MED ORDER — CEFAZOLIN SODIUM-DEXTROSE 2-4 GM/100ML-% IV SOLN
2.0000 g | INTRAVENOUS | Status: AC
  Administered 2018-08-12: 2 g via INTRAVENOUS
  Filled 2018-08-12: qty 100

## 2018-08-12 MED ORDER — PROMETHAZINE HCL 25 MG/ML IJ SOLN: 12.5000 mg | Freq: Once | INTRAMUSCULAR | Status: DC | PRN

## 2018-08-12 MED ORDER — 0.9 % SODIUM CHLORIDE (POUR BTL) OPTIME
TOPICAL | Status: DC | PRN
  Administered 2018-08-12: 09:00:00 1000 mL

## 2018-08-12 MED ORDER — CHLORHEXIDINE GLUCONATE CLOTH 2 % EX PADS: 6.0000 | MEDICATED_PAD | Freq: Once | CUTANEOUS | Status: DC

## 2018-08-12 MED ORDER — HYDROMORPHONE HCL 1 MG/ML IJ SOLN
INTRAMUSCULAR | Status: AC
  Filled 2018-08-12: qty 2

## 2018-08-12 MED ORDER — DIAZEPAM 5 MG PO TABS
ORAL_TABLET | ORAL | Status: AC
  Filled 2018-08-12: qty 2

## 2018-08-12 MED ORDER — SODIUM CHLORIDE 0.9% FLUSH
3.0000 mL | Freq: Two times a day (BID) | INTRAVENOUS | Status: DC
  Administered 2018-08-12: 3 mL via INTRAVENOUS

## 2018-08-12 MED ORDER — HYDROCODONE-ACETAMINOPHEN 5-325 MG PO TABS: 1.0000 | ORAL_TABLET | Freq: Four times a day (QID) | ORAL | Status: DC | PRN

## 2018-08-12 MED ORDER — HYDROMORPHONE HCL 1 MG/ML IJ SOLN
1.0000 mg | INTRAMUSCULAR | Status: DC | PRN
  Administered 2018-08-12: 12:00:00 1 mg via INTRAVENOUS

## 2018-08-12 MED ORDER — ONDANSETRON HCL 4 MG/2ML IJ SOLN
4.0000 mg | Freq: Four times a day (QID) | INTRAMUSCULAR | Status: DC | PRN
  Administered 2018-08-12: 4 mg via INTRAVENOUS

## 2018-08-12 MED ORDER — ONDANSETRON HCL 4 MG/2ML IJ SOLN
INTRAMUSCULAR | Status: AC
  Filled 2018-08-12: qty 2

## 2018-08-12 MED ORDER — PROPOFOL 10 MG/ML IV BOLUS
INTRAVENOUS | Status: DC | PRN
  Administered 2018-08-12: 130 mg via INTRAVENOUS
  Administered 2018-08-12: 70 mg via INTRAVENOUS

## 2018-08-12 MED ORDER — SODIUM CHLORIDE 0.9 % IV SOLN
INTRAVENOUS | Status: DC | PRN
  Administered 2018-08-12: 500 mL

## 2018-08-12 MED ORDER — MIDAZOLAM HCL 5 MG/5ML IJ SOLN
INTRAMUSCULAR | Status: DC | PRN
  Administered 2018-08-12: 2 mg via INTRAVENOUS

## 2018-08-12 MED ORDER — BUPIVACAINE HCL (PF) 0.25 % IJ SOLN
INTRAMUSCULAR | Status: DC | PRN
  Administered 2018-08-12: 20 mL

## 2018-08-12 SURGICAL SUPPLY — 97 items
ANCHOR LUMBAR 27 (Anchor) ×6 IMPLANT
ANCHOR LUMBAR 27MM (Anchor) ×3 IMPLANT
APPLIER CLIP 11 MED OPEN (CLIP) ×3
BAG DECANTER FOR FLEXI CONT (MISCELLANEOUS) ×3 IMPLANT
BENZOIN TINCTURE PRP APPL 2/3 (GAUZE/BANDAGES/DRESSINGS) ×3 IMPLANT
BUR MATCHSTICK NEURO 3.0 LAGG (BURR) IMPLANT
CANISTER SUCT 3000ML PPV (MISCELLANEOUS) ×3 IMPLANT
CARTRIDGE OIL MAESTRO DRILL (MISCELLANEOUS) ×1 IMPLANT
CLIP APPLIE 11 MED OPEN (CLIP) ×1 IMPLANT
CLOSURE STERI-STRIP 1/2X4 (GAUZE/BANDAGES/DRESSINGS) ×1
CLOSURE WOUND 1/2 X4 (GAUZE/BANDAGES/DRESSINGS) ×1
CLSR STERI-STRIP ANTIMIC 1/2X4 (GAUZE/BANDAGES/DRESSINGS) ×2 IMPLANT
COVER BACK TABLE 60X90IN (DRAPES) ×3 IMPLANT
COVER WAND RF STERILE (DRAPES) ×3 IMPLANT
DERMABOND ADVANCED (GAUZE/BANDAGES/DRESSINGS) ×2
DERMABOND ADVANCED .7 DNX12 (GAUZE/BANDAGES/DRESSINGS) ×1 IMPLANT
DIFFUSER DRILL AIR PNEUMATIC (MISCELLANEOUS) ×3 IMPLANT
DRAPE C-ARM 42X72 X-RAY (DRAPES) ×6 IMPLANT
DRAPE INCISE IOBAN 66X45 STRL (DRAPES) ×3 IMPLANT
DRAPE LAPAROTOMY 100X72X124 (DRAPES) ×3 IMPLANT
DRSG OPSITE POSTOP 4X8 (GAUZE/BANDAGES/DRESSINGS) ×3 IMPLANT
ELECT BLADE 4.0 EZ CLEAN MEGAD (MISCELLANEOUS) ×3
ELECT BLADE 6.5 EXT (BLADE) ×3 IMPLANT
ELECT REM PT RETURN 9FT ADLT (ELECTROSURGICAL) ×3
ELECTRODE BLDE 4.0 EZ CLN MEGD (MISCELLANEOUS) ×1 IMPLANT
ELECTRODE REM PT RTRN 9FT ADLT (ELECTROSURGICAL) ×1 IMPLANT
GAUZE 4X4 16PLY RFD (DISPOSABLE) IMPLANT
GAUZE SPONGE 4X4 12PLY STRL (GAUZE/BANDAGES/DRESSINGS) IMPLANT
GLOVE BIO SURGEON STRL SZ 6.5 (GLOVE) ×2 IMPLANT
GLOVE BIO SURGEON STRL SZ7.5 (GLOVE) ×6 IMPLANT
GLOVE BIO SURGEONS STRL SZ 6.5 (GLOVE) ×1
GLOVE BIOGEL PI IND STRL 6.5 (GLOVE) ×1 IMPLANT
GLOVE BIOGEL PI IND STRL 7.0 (GLOVE) ×2 IMPLANT
GLOVE BIOGEL PI IND STRL 7.5 (GLOVE) ×2 IMPLANT
GLOVE BIOGEL PI IND STRL 8 (GLOVE) ×2 IMPLANT
GLOVE BIOGEL PI INDICATOR 6.5 (GLOVE) ×2
GLOVE BIOGEL PI INDICATOR 7.0 (GLOVE) ×4
GLOVE BIOGEL PI INDICATOR 7.5 (GLOVE) ×4
GLOVE BIOGEL PI INDICATOR 8 (GLOVE) ×4
GLOVE ECLIPSE 7.5 STRL STRAW (GLOVE) ×3 IMPLANT
GLOVE ECLIPSE 9.0 STRL (GLOVE) ×3 IMPLANT
GLOVE EXAM NITRILE XL STR (GLOVE) IMPLANT
GLOVE SURG SS PI 7.5 STRL IVOR (GLOVE) ×12 IMPLANT
GOWN STRL REUS W/ TWL LRG LVL3 (GOWN DISPOSABLE) ×3 IMPLANT
GOWN STRL REUS W/ TWL XL LVL3 (GOWN DISPOSABLE) ×3 IMPLANT
GOWN STRL REUS W/TWL 2XL LVL3 (GOWN DISPOSABLE) IMPLANT
GOWN STRL REUS W/TWL LRG LVL3 (GOWN DISPOSABLE) ×6
GOWN STRL REUS W/TWL XL LVL3 (GOWN DISPOSABLE) ×6
INSERT FOGARTY 61MM (MISCELLANEOUS) IMPLANT
INSERT FOGARTY SM (MISCELLANEOUS) IMPLANT
KIT BASIN OR (CUSTOM PROCEDURE TRAY) ×3 IMPLANT
KIT INFUSE X SMALL 1.4CC (Orthopedic Implant) ×3 IMPLANT
KIT TURNOVER KIT B (KITS) ×3 IMPLANT
LOOP VESSEL MAXI BLUE (MISCELLANEOUS) IMPLANT
LOOP VESSEL MINI RED (MISCELLANEOUS) IMPLANT
NEEDLE HYPO 22GX1.5 SAFETY (NEEDLE) ×6 IMPLANT
NEEDLE SPNL 18GX3.5 QUINCKE PK (NEEDLE) IMPLANT
NS IRRIG 1000ML POUR BTL (IV SOLUTION) ×3 IMPLANT
OIL CARTRIDGE MAESTRO DRILL (MISCELLANEOUS) ×3
PACK LAMINECTOMY NEURO (CUSTOM PROCEDURE TRAY) ×3 IMPLANT
PAD ARMBOARD 7.5X6 YLW CONV (MISCELLANEOUS) ×9 IMPLANT
PUTTY BONE DBX 5CC MIX (Putty) ×3 IMPLANT
SPACER HEDRON IA 29X39 13 15D (Spacer) ×3 IMPLANT
SPONGE INTESTINAL PEANUT (DISPOSABLE) ×6 IMPLANT
SPONGE LAP 18X18 RF (DISPOSABLE) ×3 IMPLANT
SPONGE LAP 4X18 RFD (DISPOSABLE) IMPLANT
SPONGE SURGIFOAM ABS GEL 100 (HEMOSTASIS) ×3 IMPLANT
STAPLER VISISTAT (STAPLE) IMPLANT
STAPLER VISISTAT 35W (STAPLE) ×3 IMPLANT
STRIP CLOSURE SKIN 1/2X4 (GAUZE/BANDAGES/DRESSINGS) ×2 IMPLANT
SUT PDS AB 1 CTX 36 (SUTURE) ×3 IMPLANT
SUT PROLENE 4 0 RB 1 (SUTURE)
SUT PROLENE 4-0 RB1 .5 CRCL 36 (SUTURE) IMPLANT
SUT PROLENE 5 0 CC1 (SUTURE) IMPLANT
SUT PROLENE 6 0 C 1 30 (SUTURE) IMPLANT
SUT PROLENE 6 0 CC (SUTURE) IMPLANT
SUT SILK 0 TIES 10X30 (SUTURE) IMPLANT
SUT SILK 2 0 TIES 10X30 (SUTURE) ×3 IMPLANT
SUT SILK 2 0SH CR/8 30 (SUTURE) IMPLANT
SUT SILK 3 0 SH CR/8 (SUTURE) IMPLANT
SUT SILK 3 0 TIES 10X30 (SUTURE) IMPLANT
SUT SILK 3 0SH CR/8 30 (SUTURE) IMPLANT
SUT VIC AB 0 CT1 18XCR BRD8 (SUTURE) IMPLANT
SUT VIC AB 0 CT1 27 (SUTURE)
SUT VIC AB 0 CT1 27XBRD ANBCTR (SUTURE) IMPLANT
SUT VIC AB 0 CT1 27XBRD ANTBC (SUTURE) IMPLANT
SUT VIC AB 0 CT1 8-18 (SUTURE)
SUT VIC AB 2-0 CT1 18 (SUTURE) ×3 IMPLANT
SUT VIC AB 2-0 CTB1 (SUTURE) IMPLANT
SUT VIC AB 3-0 SH 27 (SUTURE)
SUT VIC AB 3-0 SH 27X BRD (SUTURE) IMPLANT
SUT VIC AB 3-0 SH 8-18 (SUTURE) ×6 IMPLANT
SUT VICRYL 4-0 PS2 18IN ABS (SUTURE) IMPLANT
TOWEL GREEN STERILE (TOWEL DISPOSABLE) ×3 IMPLANT
TOWEL GREEN STERILE FF (TOWEL DISPOSABLE) ×3 IMPLANT
TRAY FOLEY MTR SLVR 16FR STAT (SET/KITS/TRAYS/PACK) ×3 IMPLANT
WATER STERILE IRR 1000ML POUR (IV SOLUTION) ×3 IMPLANT

## 2018-08-12 NOTE — Interval H&P Note (Signed)
History and Physical Interval Note:  08/12/2018 7:18 AM  [name]  has presented today for surgery, with the diagnosis of degenerative disc disease.  The various methods of treatment have been discussed with the patient and family. After consideration of risks, benefits and other options for treatment, the patient has consented to  Procedure(s): ALIF - L5-S1 (N/A) ABDOMINAL EXPOSURE (N/A) as a surgical intervention.  The patient's history has been reviewed, patient examined, no change in status, stable for surgery.  I have reviewed the patient's chart and labs.  Questions were answered to the patient's satisfaction.     Deitra Mayo

## 2018-08-12 NOTE — Brief Op Note (Signed)
08/12/2018  9:39 AM  PATIENT:  [name]  48 y.o. male  PRE-OPERATIVE DIAGNOSIS:  degenerative disc disease  POST-OPERATIVE DIAGNOSIS:  degenerative disc disease  PROCEDURE:  Procedure(s): ANTERIOR LUMBAR INTERBODY FUSION-LUMBAR FIVE-SACRAL ONE (N/A) ABDOMINAL EXPOSURE (N/A)  SURGEON:  Surgeon(s) and Role: Panel 1:    * Earnie Larsson, MD - Primary Panel 2:    * Angelia Mould, MD - Primary  PHYSICIAN ASSISTANT:   ASSISTANTSMearl Latin   ANESTHESIA:   general  EBL:  100 mL   BLOOD ADMINISTERED:none  DRAINS: none   LOCAL MEDICATIONS USED:  MARCAINE     SPECIMEN:  No Specimen  DISPOSITION OF SPECIMEN:  N/A  COUNTS:  YES  TOURNIQUET:  * No tourniquets in log *  DICTATION: .Dragon Dictation  PLAN OF CARE: Admit to inpatient   PATIENT DISPOSITION:  PACU - hemodynamically stable.   Delay start of Pharmacological VTE agent (>24hrs) due to surgical blood loss or risk of bleeding: yes

## 2018-08-12 NOTE — Anesthesia Postprocedure Evaluation (Signed)
Anesthesia Post Note  Patient: [name]  Procedure(s) Performed: ANTERIOR LUMBAR INTERBODY FUSION-LUMBAR FIVE-SACRAL ONE (N/A Abdomen) ABDOMINAL EXPOSURE (N/A Abdomen)     Patient location during evaluation: PACU Anesthesia Type: General Level of consciousness: awake and alert and oriented Pain management: pain level controlled Vital Signs Assessment: post-procedure vital signs reviewed and stable Respiratory status: spontaneous breathing, nonlabored ventilation, respiratory function stable and patient connected to nasal cannula oxygen Cardiovascular status: blood pressure returned to baseline and stable Postop Assessment: no apparent nausea or vomiting Anesthetic complications: no    Last Vitals:  Vitals:   08/12/18 1100 08/12/18 1110  BP: 114/80   Pulse: 98   Resp: 15 16  Temp:  (!) 36.1 C  SpO2: 91%     Last Pain:  Vitals:   08/12/18 1110  TempSrc:   PainSc: 5     LLE Motor Response: Purposeful movement;Responds to commands (08/12/18 1110) LLE Sensation: Full sensation (08/12/18 1110) RLE Motor Response: Purposeful movement;Responds to commands (08/12/18 1110) RLE Sensation: Full sensation (08/12/18 1110)      Ronae Noell A.

## 2018-08-12 NOTE — Op Note (Signed)
Date of procedure: 08/12/2018  Date of dictation: Same  Service: Neurosurgery  Preoperative diagnosis: L5-S1 degenerative disc disease with mild retrolisthesis and foraminal stenosis.  Postoperative diagnosis: Same  Procedure Name: L5-S1 anterior lumbar interbody fusion utilizing interbody titanium cage, morselized allograft, bone morphogenic protein, and anterior plate instrumentation.  Surgeon:Dailan Pfalzgraf A.Anthonyjames Bargar, M.D.  Asst. Surgeon: Reinaldo Meeker, NP  Vascular surgeon: Scot Dock  Anesthesia: General  Indication: 48 year old male with chronic intractable back pain failing all of his conservative management.  Work-up demonstrates evidence of marked disc degeneration with disc space collapse and broad-based disc protrusion with significant foraminal stenosis.  Patient presents now for anterior lumbar interbody fusion in hopes of improving his symptoms.  Operative note: After induction of anesthesia, patient position supine with arms outstretched.  Patient's lower abdomen was prepped and draped sterilely.  Localizing fluoroscopy was used.  Dr. Scot Dock then performed an incision in the left lower quadrant.  Dissection was then made down to the rectus sheath.  Rectus sheath was divided.  Rectus muscle was mobilized.  Working lateral to the rectus muscle on the left side the retroperitoneal space was entered.  Using a mixture of blunt and sharp dissection the sacral promontory was identified as were the iliac vessels.  Retractors were placed bilaterally.  Middle sacral vessels were coagulated and cut.  Localizing x-rays confirmed the L5-S1 interspace and marked the midline.  I then performed a discectomy by first incising the disc and then removing disc material using various curettes and Kerrison rongeurs down to level the posterior logical ligament.  Posterior ligament was entered and the underlying dura was visualized.  There was no evidence of injury to the thecal sac or nerve roots.  Decompression then  proceeded to each neural foramina.  Exiting L5 nerve roots were identified.  The to space was then irrigated.  The disc base was incised.  A globus medical head drawn cage sized 13 mm high by 15 degrees, large was then packed with morselized DBX putty and bone morphogenic protein sponges.  This was then impacted in the place under fluoroscopic guidance.  After the cage was well positioned locking anchors were then impacted into L5 and S1.  A locking anchor was placed into L5 in the midline into locking anchors were placed into the sacrum.  Final images reveal good position the cage and the hardware at the proper operative level with normal alignment of spine.  Wound is irrigated one final time.  Then closed in a typical fashion.  Steri-Strips and sterile dressing were applied.  No apparent complications.  Patient tolerated the procedure well and he returns to the recovery room postop.

## 2018-08-12 NOTE — Transfer of Care (Signed)
Immediate Anesthesia Transfer of Care Note  Patient: [name]  Procedure(s) Performed: ANTERIOR LUMBAR INTERBODY FUSION-LUMBAR FIVE-SACRAL ONE (N/A Abdomen) ABDOMINAL EXPOSURE (N/A Abdomen)  Patient Location: PACU  Anesthesia Type:General  Level of Consciousness: awake, alert  and oriented  Airway & Oxygen Therapy: Patient Spontanous Breathing and Patient connected to nasal cannula oxygen  Post-op Assessment: Report given to RN and Post -op Vital signs reviewed and stable  Post vital signs: Reviewed and stable ABP 133/57  Last Vitals:  Vitals Value Taken Time  BP 151/127 08/12/18 1012  Temp    Pulse 105 08/12/18 1015  Resp 16 08/12/18 1015  SpO2 97 % 08/12/18 1015  Vitals shown include unvalidated device data.  Last Pain:  Vitals:   08/12/18 0639  TempSrc:   PainSc: 5       Patients Stated Pain Goal: 3 (76/19/50 9326)  Complications: No apparent anesthesia complications

## 2018-08-12 NOTE — Anesthesia Procedure Notes (Signed)
Arterial Line Insertion Start/End8/04/2018 7:00 AM, 08/12/2018 7:05 AM Performed by: Josephine Igo, MD, Marsa Aris, CRNA, CRNA  Patient location: Pre-op. Preanesthetic checklist: patient identified, IV checked, site marked, risks and benefits discussed, surgical consent, monitors and equipment checked, pre-op evaluation, timeout performed and anesthesia consent Lidocaine 1% used for infiltration Left, radial was placed Catheter size: 20 Fr Hand hygiene performed , maximum sterile barriers used  and Seldinger technique used Allen's test indicative of satisfactory collateral circulation Attempts: 1 Procedure performed without using ultrasound guided technique. Following insertion, dressing applied. Post procedure assessment: normal and unchanged  Patient tolerated the procedure well with no immediate complications.

## 2018-08-12 NOTE — H&P (Signed)
  [name] is an 48 y.o. male.   Chief Complaint: Back pain HPI: 48 year old male with chronic and progressive lower back pain with intermittent radicular symptoms.'s back pain is failed conservative management.  Work-up demonstrates evidence of marked disc space collapse with Modic endplate changes and moderately severe foraminal stenosis.  Patient presents now for lumbar decompression and fusion L5-S1.  Past Medical History:  Diagnosis Date  . Chronic back pain   . Hypertension   . Pneumothorax    right    Past Surgical History:  Procedure Laterality Date  . SHOULDER SURGERY Right     History reviewed. No pertinent family history. Social History:  reports that he has never smoked. He has never used smokeless tobacco. He reports current alcohol use. He reports current drug use. Drug: Hydrocodone.  Allergies: No Known Allergies  Medications Prior to Admission  Medication Sig Dispense Refill  . HYDROcodone-acetaminophen (NORCO/VICODIN) 5-325 MG tablet Take 1 tablet by mouth every 6 (six) hours as needed for pain.    Marland Kitchen lansoprazole (PREVACID) 15 MG capsule Take 15 mg by mouth every morning.     Marland Kitchen lisinopril-hydrochlorothiazide (ZESTORETIC) 20-12.5 MG tablet Take 1 tablet by mouth daily.      No results found for this or any previous visit (from the past 48 hour(s)). No results found.  Pertinent items noted in HPI and remainder of comprehensive ROS otherwise negative.  Blood pressure (!) 151/95, pulse 78, temperature 98.3 F (36.8 C), temperature source Oral, resp. rate 18, height 5\' 7"  (1.702 m), weight 91.4 kg, SpO2 99 %.  Patient is awake and alert.  He is oriented and appropriate.  Speech is fluent.  Judgment insight are intact.  Cranial nerve function normal bilateral.  Motor examination intact bilateral presents examination with some mild decrease sensation pinprick and light touch in his L5 dermatomes bilateral.  Deep tender versus normal active step his Achilles  reflexes are hypoactive.  No evidence of long track signs.  Gait is antalgic.  Posture is mildly flexed per examination head ears eyes nose throat is unremarkable her chest and abdomen are benign.  Extremities are free from injury or deformity. Assessment/Plan L5-S1 degenerative disc disease with foraminal stenosis.  Plan anterior lumbar interbody fusion utilizing interbody cage, anterior plate fixation, and morselized allograft with bone morphogenic protein.  Risks and benefits of been explained.  Patient wishes to proceed.  Satanta A Beadie Matsunaga 08/12/2018, 7:40 AM

## 2018-08-12 NOTE — Anesthesia Procedure Notes (Addendum)
Procedure Name: Intubation Date/Time: 08/12/2018 8:00 AM Performed by: Josephine Igo, MD Pre-anesthesia Checklist: Patient identified, Emergency Drugs available, Suction available, Patient being monitored and Timeout performed Patient Re-evaluated:Patient Re-evaluated prior to induction Oxygen Delivery Method: Circle system utilized Preoxygenation: Pre-oxygenation with 100% oxygen Induction Type: IV induction Laryngoscope Size: Mac and 3 Grade View: Grade II Tube type: Oral Tube size: 7.5 mm Number of attempts: 1 Airway Equipment and Method: Stylet and Oral airway Placement Confirmation: ETT inserted through vocal cords under direct vision,  positive ETCO2 and breath sounds checked- equal and bilateral Secured at: 22 cm Tube secured with: Tape Dental Injury: Teeth and Oropharynx as per pre-operative assessment

## 2018-08-12 NOTE — Op Note (Signed)
    NAME: [name]    MRN: [id number] DOB: 11-15-1970    DATE OF OPERATION: 08/12/2018  PREOP DIAGNOSIS:    Degenerative disc disease L5-S1  POSTOP DIAGNOSIS:    Same  PROCEDURE:    Anterior retroperitoneal exposure L5-S1  EXPOSURE SURGEON: Judeth Cornfield. Scot Dock, MD  SPINE SURGEON: ASSIST: Earnie Larsson, MD  ANESTHESIA: General  EBL: Minimal  INDICATIONS:    [name] is a 48 y.o. male who presents for anterior lumbar interbody fusion of L5-S1.  We were asked to provide anterior retroperitoneal exposure.  FINDINGS:   The iliac vessels were soft without significant disease.  TECHNIQUE:   The patient was taken to the operating room and received a general anesthetic.  Under fluoroscopy the level of the L5-S1 disc space was marked.  Of note this was fairly low so the incision was going to have to be made not to too far above the pubis.  The abdomen was prepped and draped in usual sterile fashion.  Transverses incision was made to the left of the midline at the marked level.  The dissection was carried down to the anterior rectus sheath.  This was opened transversely.  This was mobilized superiorly and inferiorly.  The rectus abdominis muscle was mobilized medially and the retroperitoneal space entered.  The dissection was carried down to the iliac vessels which were soft.  The dissection was extended bluntly up to the sacral promontory.  The middle sacral vessels were divided using electrocautery.  The L5-S1 disc space was exposed and the Thompson retractor system placed.  The reverse lip retractor blades were placed exposing the L5-S1 disc space.  The remainder of the dictation is as per Dr. Annette Stable.  Deitra Mayo, MD, FACS [facility]  DATE OF DICTATION:   08/12/2018

## 2018-08-13 ENCOUNTER — Encounter (HOSPITAL_COMMUNITY): Payer: Self-pay | Admitting: Neurosurgery

## 2018-08-13 MED ORDER — OXYCODONE HCL 10 MG PO TABS: 10.0000 mg | ORAL_TABLET | ORAL | 0 refills | Status: DC | PRN

## 2018-08-13 MED ORDER — DIAZEPAM 5 MG PO TABS: 5.0000 mg | ORAL_TABLET | Freq: Four times a day (QID) | ORAL | 0 refills | Status: DC | PRN

## 2018-08-13 NOTE — Progress Notes (Signed)
   VASCULAR SURGERY ASSESSMENT & PLAN:   1 Day Post-Op s/p: L5-S1 ALIF. Doing well.  Vascular will be available as needed.    SUBJECTIVE:   Pain well controlled  PHYSICAL EXAM:   Vitals:   08/12/18 1623 08/12/18 1930 08/12/18 2300 08/13/18 0430  BP: (!) 130/92 133/65 100/62 102/67  Pulse: 91 77 90 73  Resp: 18 20 20 20   Temp: (!) 97.1 F (36.2 C) 98.3 F (36.8 C) 98.2 F (36.8 C) (!) 97.5 F (36.4 C)  TempSrc:  Oral Oral Oral  SpO2: 98% 99% 98% 96%  Weight:      Height:       Palpable DP pulse on left.   LABS:    PROBLEM LIST:    Active Problems:   DDD (degenerative disc disease), lumbosacral  CURRENT MEDS:   . hydrochlorothiazide  12.5 mg Oral Daily  . lisinopril  20 mg Oral Daily  . pantoprazole  20 mg Oral Daily  . sodium chloride flush  3 mL Intravenous Q12H    Deitra Mayo Beeper: [phone] Office: [phone] 08/13/2018

## 2018-08-13 NOTE — Evaluation (Signed)
Physical Therapy Evaluation Patient Details Name: [name] MRN: [id number] DOB: [birth year] Today's Date: 08/13/2018   History of Present Illness  Pt is a 48 y/o male now s/p ALIF L5-S1. PMHx includes HTN, pneumothorax, R shoulder surgery   Clinical Impression  Pt admitted with above diagnosis. Pt currently with functional limitations due to the deficits listed below (see PT Problem List). At the time of PT eval pt was able to perform transfers and ambulation with gross min guard assist for balance support and safety. Pt limited this session by pain and shivering. Reports he has been ambulating well in the hallway and RN attested that he was stand by assist without an AD throughout the night/this morning. Will continue to follow and progress as able per POC. Pt will benefit from skilled PT to increase their independence and safety with mobility to allow discharge to the venue listed below.       Follow Up Recommendations No PT follow up;Supervision for mobility/OOB    Equipment Recommendations  Rolling walker with 5" wheels    Recommendations for Other Services       Precautions / Restrictions Precautions Precautions: Fall;Back Precaution Booklet Issued: Yes (comment) Precaution Comments: issued and reviewed with pt  Required Braces or Orthoses: Spinal Brace Spinal Brace: Lumbar corset;Applied in sitting position Restrictions Weight Bearing Restrictions: No      Mobility  Bed Mobility Overal bed mobility: Needs Assistance Bed Mobility: Rolling;Sidelying to Sit;Sit to Sidelying Rolling: Min guard Sidelying to sit: Min guard     Sit to sidelying: Min guard General bed mobility comments: use of bedrail; increased time/effort and cues to utilize log roll technique  Transfers Overall transfer level: Needs assistance Equipment used: Rolling walker (2 wheeled) Transfers: Sit to/from Stand Sit to Stand: Min assist;Min guard         General transfer comment: No assist  required to power-up to full stand, however pt did require min assist to control descent back to EOB with stand>sit.   Ambulation/Gait Ambulation/Gait assistance: Min guard Gait Distance (Feet): 75 Feet Assistive device: Rolling walker (2 wheeled) Gait Pattern/deviations: Step-to pattern;Decreased stride length;Trunk flexed Gait velocity: Decreased   General Gait Details: VC's for improved posture and sequencing with the RW. Pt with difficulty bearing weight through LLE and performing swing through on L side. Limited by shivering  Stairs            Wheelchair Mobility    Modified Rankin (Stroke Patients Only)       Balance Overall balance assessment: Needs assistance Sitting-balance support: Feet supported Sitting balance-Leahy Scale: Fair     Standing balance support: Bilateral upper extremity supported Standing balance-Leahy Scale: Poor Standing balance comment: heavy reliance on UE support                             Pertinent Vitals/Pain Pain Assessment: Faces Faces Pain Scale: Hurts whole lot Pain Location: back/incisional down into LLE Pain Descriptors / Indicators: Discomfort;Guarding;Sharp;Shooting Pain Intervention(s): Limited activity within patient's tolerance;Monitored during session;Repositioned    Home Living Family/patient expects to be discharged to:: Private residence Living Arrangements: Spouse/significant other(fiance) Available Help at Discharge: Family Type of Home: House Home Access: Stairs to enter Entrance Stairs-Rails: None Entrance Stairs-Number of Steps: 2 Home Layout: One level Home Equipment: None Additional Comments: pt reports will have assist from fiance' who is a Marine scientist     Prior Function Level of Independence: Independent  Hand Dominance        Extremity/Trunk Assessment   Upper Extremity Assessment Upper Extremity Assessment: Defer to OT evaluation    Lower Extremity Assessment Lower  Extremity Assessment: RLE deficits/detail;LLE deficits/detail RLE Deficits / Details: Pt reports tremors in RLE and noted he had difficulty controlling RLE with initial stand 2 tremoring. Pt shaking all over, stating he has the chills.  LLE Deficits / Details: Pt reports burning in LLE that has been present since before surgery    Cervical / Trunk Assessment Cervical / Trunk Assessment: Other exceptions Cervical / Trunk Exceptions: s/p spinal surgery  Communication   Communication: No difficulties  Cognition Arousal/Alertness: Awake/alert Behavior During Therapy: WFL for tasks assessed/performed Overall Cognitive Status: Within Functional Limits for tasks assessed                                        General Comments General comments (skin integrity, edema, etc.): pt with significant shivering/trembling with mobility this session; vitals taken after return to supine in bed including O2 sats and temperature and both WNL    Exercises Other Exercises Other Exercises: issued IS and educated in use; pt return demonstrating with good carryover    Assessment/Plan    PT Assessment Patient needs continued PT services  PT Problem List Decreased strength;Decreased activity tolerance;Decreased balance;Decreased mobility;Decreased safety awareness;Decreased knowledge of use of DME;Decreased knowledge of precautions;Pain       PT Treatment Interventions DME instruction;Gait training;Functional mobility training;Therapeutic activities;Therapeutic exercise;Neuromuscular re-education;Patient/family education    PT Goals (Current goals can be found in the Care Plan section)  Acute Rehab PT Goals Patient Stated Goal: home when able PT Goal Formulation: With patient Time For Goal Achievement: 08/20/18 Potential to Achieve Goals: Good    Frequency Min 5X/week   Barriers to discharge        Co-evaluation               AM-PAC PT "6 Clicks" Mobility  Outcome Measure  Help needed turning from your back to your side while in a flat bed without using bedrails?: None Help needed moving from lying on your back to sitting on the side of a flat bed without using bedrails?: None Help needed moving to and from a bed to a chair (including a wheelchair)?: A Little Help needed standing up from a chair using your arms (e.g., wheelchair or bedside chair)?: A Little Help needed to walk in hospital room?: A Little Help needed climbing 3-5 steps with a railing? : A Little 6 Click Score: 20    End of Session Equipment Utilized During Treatment: Gait belt Activity Tolerance: Patient tolerated treatment well Patient left: in bed Nurse Communication: Mobility status PT Visit Diagnosis: Unsteadiness on feet (R26.81)    Time: 3536-1443 PT Time Calculation (min) (ACUTE ONLY): 31 min   Charges:   PT Evaluation $PT Eval Moderate Complexity: 1 Mod PT Treatments $Gait Training: 8-22 mins        Conni Slipper, PT, DPT Acute Rehabilitation Services Pager: [phone] Office: [phone]   Marylynn Pearson 08/13/2018, 1:16 PM

## 2018-08-13 NOTE — Plan of Care (Signed)
Pt doing well. Pt given D/C instructions with verbal understanding. Rx's were e-prescribed to Pt's pharmacy. Pt's incision is clean and dry with no sign of infection. Pt's IV was removed prior to D/C. Pt's IV was removed prior to D/C. Pt received RW and 3-n-1 per MD order. Pt D/C'd home via wheelchair per MD order. Pt is stable @ D/C and has no other needs at this time. Holli Humbles, RN

## 2018-08-13 NOTE — Evaluation (Addendum)
Occupational Therapy Evaluation Patient Details Name: [name] MRN: [id number] DOB: 25-Dec-1970 Today's Date: 08/13/2018    History of Present Illness Pt is a 48 y/o male now s/p ALIF L5-S1. PMHx includes HTN, pneumothorax, R shoulder surgery    Clinical Impression   This 48 y/o male presents with the above. PTA pt reports independence with ADL and mobility. Pt greeted standing in hallway with PT;  pt with SIGNIFICANT shivering/trembling noted (reports due to being very cold), as well as reports shooting pain down LLE. Pt tolerating only short distance mobility with use of RW and minA (+2 safety) prior to needing to return to room/bed due to pain/discomfort. Blankets applied and vitals monitored with VSS. Pt reports feeling better with rest/return to supine. He reports he was able to ambulate earlier this AM without any difficulty. He currently requires minguard assist for seated UB ADL, at least modA for LB ADL. Suspect pt will progress well once pain and these symptoms continues to subside. He reports plans to return home with fiance' who is a Engineer, civil (consulting) and can assist with ADL/iADL PRN. Educated pt re: back precautions, safety and compensatory techniques for performing ADL and functional transfers after return home. Pt will benefit from continued acute OT services to maximize his overall safety and independence with ADL and mobility prior to discharge. Will continue to follow.     Follow Up Recommendations  No OT follow up;Supervision/Assistance - 24 hour    Equipment Recommendations  3 in 1 bedside commode(for use in shower)           Precautions / Restrictions Precautions Precautions: Fall;Back Precaution Booklet Issued: Yes (comment) Precaution Comments: issued and reviewed with pt  Required Braces or Orthoses: Spinal Brace Spinal Brace: Lumbar corset;Applied in sitting position Restrictions Weight Bearing Restrictions: No      Mobility Bed Mobility Overal bed mobility: Needs  Assistance Bed Mobility: Sit to Sidelying;Rolling Rolling: Min guard       Sit to sidelying: Min guard General bed mobility comments: use of bedrail; increased time/effort and cues to utilize log roll technique  Transfers Overall transfer level: Needs assistance Equipment used: Rolling walker (2 wheeled) Transfers: Sit to/from Stand Sit to Stand: Min assist         General transfer comment: pt standing with PT upon arrival to session, requires assist for descent to EOB due to pain/shivering     Balance Overall balance assessment: Needs assistance Sitting-balance support: Feet supported Sitting balance-Leahy Scale: Fair     Standing balance support: Bilateral upper extremity supported Standing balance-Leahy Scale: Poor Standing balance comment: heavy reliance on UE support                           ADL either performed or assessed with clinical judgement   ADL Overall ADL's : Needs assistance/impaired Eating/Feeding: Modified independent;Sitting   Grooming: Set up;Sitting;Bed level   Upper Body Bathing: Min guard;Sitting   Lower Body Bathing: Moderate assistance;Sit to/from stand   Upper Body Dressing : Min guard;Sitting Upper Body Dressing Details (indicate cue type and reason): pt managing lumbar corsett (donning prior to return to supine) on his own this session Lower Body Dressing: Moderate assistance;Sit to/from stand Lower Body Dressing Details (indicate cue type and reason): given need to maintain back precautions Toilet Transfer: Minimal assistance;+2 for safety/equipment;Ambulation;RW Toilet Transfer Details (indicate cue type and reason): simulated via transfer to EOB, room and hallway mobility Toileting- Clothing Manipulation and Hygiene: Moderate assistance;Minimal assistance;Sit  to/from stand Toileting - ArchitectClothing Manipulation Details (indicate cue type and reason): educated in importance of maintaining back precautions with [facility]     Tub/Shower Transfer Details (indicate cue type and reason): educated pt in use of 3:1 as shower seat, to increase ease of reaching feet while maintaining back precautions and for increased safety Functional mobility during ADLs: Minimal assistance;+2 for physical assistance;+2 for safety/equipment;Rolling walker General ADL Comments: pt greeted in hallway working with PT, joined session as pt with significant pain/difficulty with mobility; shivering tremendously (reports due to being very cold). pt reports he was up and ambulating earlier this AM Without any difficulty. assisted with transition back to room and to bed; verbally reviewed back precautions and compensatory strategies for performing ADL and functional transfers as pt was not able to tolerate further activity at this time. pt reports having gotten dressed yesterday without difficulty and reports will have assist from fiance at home                         Pertinent Vitals/Pain Pain Assessment: Faces Faces Pain Scale: Hurts whole lot Pain Location: back/incisional down into LLE Pain Descriptors / Indicators: Discomfort;Guarding;Sharp;Shooting Pain Intervention(s): Limited activity within patient's tolerance;Monitored during session;Repositioned     Hand Dominance     Extremity/Trunk Assessment Upper Extremity Assessment Upper Extremity Assessment: Overall WFL for tasks assessed(pt ++ shivering this session so with tremors/shaking bil UE)   Lower Extremity Assessment Lower Extremity Assessment: Defer to PT evaluation   Cervical / Trunk Assessment Cervical / Trunk Assessment: Other exceptions Cervical / Trunk Exceptions: s/p spinal surgery   Communication Communication Communication: No difficulties   Cognition Arousal/Alertness: Awake/alert Behavior During Therapy: WFL for tasks assessed/performed Overall Cognitive Status: Within Functional Limits for tasks assessed                                      General Comments  pt with significant shivering/trembling with mobility this session; vitals taken after return to supine in bed including O2 sats and temperature and both WNL    Exercises Exercises: Other exercises Other Exercises Other Exercises: issued IS and educated in use; pt return demonstrating with good carryover    Shoulder Instructions      Home Living Family/patient expects to be discharged to:: Private residence Living Arrangements: Spouse/significant other(fiance) Available Help at Discharge: Family Type of Home: House Home Access: Stairs to enter Secretary/administratorntrance Stairs-Number of Steps: 2 Entrance Stairs-Rails: None Home Layout: One level     Bathroom Shower/Tub: Producer, television/film/videoWalk-in shower   Bathroom Toilet: Standard     Home Equipment: None   Additional Comments: pt reports will have assist from fiance' who is a Engineer, civil (consulting)nurse       Prior Functioning/Environment Level of Independence: Independent                 OT Problem List: Decreased strength;Decreased range of motion;Decreased activity tolerance;Impaired balance (sitting and/or standing);Decreased knowledge of use of DME or AE;Decreased knowledge of precautions;Pain      OT Treatment/Interventions: Self-care/ADL training;Therapeutic exercise;Neuromuscular education;DME and/or AE instruction;Therapeutic activities;Patient/family education;Balance training    OT Goals(Current goals can be found in the care plan section) Acute Rehab OT Goals Patient Stated Goal: home when able OT Goal Formulation: With patient Time For Goal Achievement: 08/27/18 Potential to Achieve Goals: Good  OT Frequency: Min 2X/week   Barriers to D/C:  Co-evaluation PT/OT/SLP Co-Evaluation/Treatment: (overlap with PT due to pt with sig pain/shivering w/ moving )            AM-PAC OT "6 Clicks" Daily Activity     Outcome Measure Help from another person eating meals?: None Help from another person taking care of personal  grooming?: None Help from another person toileting, which includes using toliet, bedpan, or urinal?: A Little Help from another person bathing (including washing, rinsing, drying)?: A Little Help from another person to put on and taking off regular upper body clothing?: A Little Help from another person to put on and taking off regular lower body clothing?: A Lot 6 Click Score: 19   End of Session Equipment Utilized During Treatment: Rolling walker;Back brace Nurse Communication: Mobility status  Activity Tolerance: Patient limited by pain;Other (comment)(significant shivering/trembling (reports very cold)) Patient left: in bed;with call bell/phone within reach  OT Visit Diagnosis: Unsteadiness on feet (R26.81);Other abnormalities of gait and mobility (R26.89);Pain Pain - part of body: (back)                Time: 0174-9449 OT Time Calculation (min): 15 min Charges:  OT General Charges $OT Visit: 1 Visit OT Evaluation $OT Eval Low Complexity: 1 Low  Lou Cal, OT [facility] Pager [phone] Office [phone]  Raymondo Band 08/13/2018, 10:45 AM

## 2018-08-13 NOTE — Discharge Instructions (Signed)

## 2018-08-13 NOTE — Discharge Summary (Signed)
  Physician Discharge Summary  Patient ID: [name] MRN: [id number] DOB/AGE: 50/50/1972 50 y.o.  Admit date: 08/12/2018 Discharge date: 08/13/2018  Admission Diagnoses:  Discharge Diagnoses:  Active Problems:   DDD (degenerative disc disease), lumbosacral   Discharged Condition: good  Hospital Course: Patient admitted to the hospital where he underwent uncomplicated D1-V6 anterior lumbar interbody fusion.  Postoperatively doing reasonably well.  Still with a lot of incisional and back pain.  No new lower extremity symptoms.  Standing and ambulating and voiding without difficulty.  Ready for discharge home.  Consults:   Significant Diagnostic Studies:   Treatments:   Discharge Exam: Blood pressure 115/83, pulse 75, temperature 98.4 F (36.9 C), temperature source Oral, resp. rate 16, height 5\' 7"  (1.702 m), weight 91.4 kg, SpO2 100 %. Awake and alert.  Oriented and appropriate.  Cranial nerve function intact.  Motor and sensory function extremities intact.  Wound clean and dry.  Chest and abdomen benign.  Disposition: Discharge disposition: 01-Home or Self Care        Allergies as of 08/13/2018   No Known Allergies     Medication List    STOP taking these medications   HYDROcodone-acetaminophen 5-325 MG tablet Commonly known as: NORCO/VICODIN     TAKE these medications   diazepam 5 MG tablet Commonly known as: VALIUM Take 1-2 tablets (5-10 mg total) by mouth every 6 (six) hours as needed for muscle spasms.   lansoprazole 15 MG capsule Commonly known as: PREVACID Take 15 mg by mouth every morning.   lisinopril-hydrochlorothiazide 20-12.5 MG tablet Commonly known as: ZESTORETIC Take 1 tablet by mouth daily.   Oxycodone HCl 10 MG Tabs Take 1 tablet (10 mg total) by mouth every 3 (three) hours as needed for severe pain ((score 7 to 10)).            Durable Medical Equipment  (From admission, onward)         Start     Ordered   08/12/18 1116  DME  Walker rolling  Once    Question:  Patient needs a walker to treat with the following condition  Answer:  DDD (degenerative disc disease), lumbosacral   08/12/18 1115   08/12/18 1116  DME 3 n 1  Once     08/12/18 1115           Signed: Cooper Render Chayah Mckee 08/13/2018, 9:51 AM

## 2018-08-14 MED FILL — Sodium Chloride IV Soln 0.9%: INTRAVENOUS | Qty: 1000 | Status: AC

## 2018-08-14 MED FILL — Heparin Sodium (Porcine) Inj 1000 Unit/ML: INTRAMUSCULAR | Qty: 30 | Status: AC

## 2019-02-10 DIAGNOSIS — K219 Gastro-esophageal reflux disease without esophagitis: Secondary | ICD-10-CM | POA: Insufficient documentation

## 2019-02-10 DIAGNOSIS — F419 Anxiety disorder, unspecified: Secondary | ICD-10-CM | POA: Insufficient documentation

## 2019-02-10 DIAGNOSIS — F331 Major depressive disorder, recurrent, moderate: Secondary | ICD-10-CM

## 2019-02-10 HISTORY — DX: Anxiety disorder, unspecified: F41.9

## 2019-02-10 HISTORY — DX: Major depressive disorder, recurrent, moderate: F33.1

## 2019-02-10 HISTORY — DX: Gastro-esophageal reflux disease without esophagitis: K21.9

## 2019-02-19 DIAGNOSIS — M461 Sacroiliitis, not elsewhere classified: Secondary | ICD-10-CM | POA: Insufficient documentation

## 2019-02-19 DIAGNOSIS — T819XXA Unspecified complication of procedure, initial encounter: Secondary | ICD-10-CM

## 2019-02-19 HISTORY — DX: Unspecified complication of procedure, initial encounter: T81.9XXA

## 2019-02-19 HISTORY — DX: Sacroiliitis, not elsewhere classified: M46.1

## 2019-03-19 DIAGNOSIS — R1032 Left lower quadrant pain: Secondary | ICD-10-CM | POA: Insufficient documentation

## 2019-03-19 HISTORY — DX: Left lower quadrant pain: R10.32

## 2019-04-07 ENCOUNTER — Ambulatory Visit: Payer: Medicaid Other | Admitting: Neurology

## 2019-04-09 ENCOUNTER — Encounter: Payer: Self-pay | Admitting: Neurology

## 2019-04-09 ENCOUNTER — Ambulatory Visit: Payer: Medicaid Other | Admitting: Neurology

## 2019-04-09 ENCOUNTER — Other Ambulatory Visit: Payer: Self-pay

## 2019-04-09 VITALS — BP 102/66 | HR 80 | Temp 98.1°F

## 2019-04-09 DIAGNOSIS — R29898 Other symptoms and signs involving the musculoskeletal system: Secondary | ICD-10-CM | POA: Diagnosis not present

## 2019-04-09 DIAGNOSIS — R202 Paresthesia of skin: Secondary | ICD-10-CM | POA: Diagnosis not present

## 2019-04-09 DIAGNOSIS — R251 Tremor, unspecified: Secondary | ICD-10-CM | POA: Diagnosis not present

## 2019-04-09 DIAGNOSIS — R200 Anesthesia of skin: Secondary | ICD-10-CM

## 2019-04-09 NOTE — Progress Notes (Signed)
Subjective:    Patient ID: [name] is a 49 y.o. male.  HPI     Jonathan Age, MD, PhD [facility] [address], [address] [place], [place] [address]  Dear Almyra Free,   I saw your patient, [name], upon your kind request in my neurologic clinic today for initial consultation of his tremor.  The patient is unaccompanied today.  As you recall [name] is a 49 year old right-handed gentleman with an underlying medical history of hypertension, pneumothorax in the past, low back pain with status post L5-S1 anterior lumbar interbody fusion on 08/12/2018 with residual back pain, on narcotic pain medication, as well as mild obesity, who reports a bilateral hand tremor for the past 1 month.  I reviewed your office note from 03/24/2019. Intermittent hand tremors, affecting both hands.  He denies a family history of tremors.  The tremors have been ongoing for the past month.  He also has a longer standing history of lower extremity numbness, tingling, pain and weakness.  He had seen Dr. Ermalene Postin in neurology for this in 2019 in early 2020, had work-up in the form of blood tests, EMG nerve conduction velocity testing and from what I could see in the chart also bone scan as well as a brain MRI.  I did not have all the results available for my review in the chart, I reviewed the office note from January 2020.  Patient had labs on 02/03/2018 including ANA, SPEP, ESR, CK.  B12 in the more distant past was normal in the 500 range.  His CK level was 45, ESR 7, no M spike was seen on protein electrophoresis and ANA was negative.  The patient states that he uses a walker at all times.  He has pain in both legs, he also has pain in the right hip which has been ongoing for the past at least 2 years from what I can see.  He reports that he had an x-ray of the right hip.  He was recently started on Lyrica 25 mg twice daily and has been taking Percocet 1 pill 3 times daily.  He does not  sleep well at all.  He has talked to his primary care physician about doing a sleep study.  He would like to stay in [place] for this.  He has an appointment with his primary care physician early next week.  PCP had started him on trazodone which does not help.  The only way it had helped in the recent past was alongside drinking alcohol such as wine or 3 beers.  The patient is strongly discouraged from combining trazodone and alcohol. He drinks caffeine and limitation, 1 serving of tea per day, otherwise he drinks water and Powerade.  He does not drink alcohol daily. I did not see the EMG and nerve conduction velocity test results in his chart but it was mentioned as unremarkable and Dr. Conrad Kula note from January 2020. He was started on gabapentin in January but could not tolerate it.  His Past Medical History Is Significant For: Past Medical History:  Diagnosis Date  . Chronic back pain   . Hypertension   . Pneumothorax    right    His Past Surgical History Is Significant For: Past Surgical History:  Procedure Laterality Date  . ABDOMINAL EXPOSURE N/A 08/12/2018   Procedure: ABDOMINAL EXPOSURE;  Surgeon: Angelia Mould, MD;  Location: [facility] OR;  Service: Vascular;  Laterality: N/A;  . ANTERIOR LUMBAR FUSION N/A  08/12/2018   Procedure: ANTERIOR LUMBAR INTERBODY FUSION-LUMBAR FIVE-SACRAL ONE;  Surgeon: Earnie Larsson, MD;  Location: [facility];  Service: Neurosurgery;  Laterality: N/A;  . SHOULDER SURGERY Right     His Family History Is Significant For: No family history on file.  His Social History Is Significant For: Social History   Socioeconomic History  . Marital status: Single    Spouse name: Not on file  . Number of children: Not on file  . Years of education: Not on file  . Highest education level: Not on file  Occupational History  . Not on file  Tobacco Use  . Smoking status: Never Smoker  . Smokeless tobacco: Never Used  Substance and Sexual Activity  . Alcohol use: Yes     Comment: occasional  . Drug use: Yes    Types: Hydrocodone  . Sexual activity: Not on file  Other Topics Concern  . Not on file  Social History Narrative  . Not on file   Social Determinants of Health   Financial Resource Strain:   . Difficulty of Paying Living Expenses:   Food Insecurity:   . Worried About Charity fundraiser in the Last Year:   . Arboriculturist in the Last Year:   Transportation Needs:   . Film/video editor (Medical):   Marland Kitchen Lack of Transportation (Non-Medical):   Physical Activity:   . Days of Exercise per Week:   . Minutes of Exercise per Session:   Stress:   . Feeling of Stress :   Social Connections:   . Frequency of Communication with Friends and Family:   . Frequency of Social Gatherings with Friends and Family:   . Attends Religious Services:   . Active Member of Clubs or Organizations:   . Attends Archivist Meetings:   Marland Kitchen Marital Status:     His Allergies Are:  Allergies  Allergen Reactions  . Duloxetine Other (See Comments)    shaking  . Gabapentin   :   His Current Medications Are:  Outpatient Encounter Medications as of 04/09/2019  Medication Sig  . aspirin EC 81 MG tablet Take 81 mg by mouth daily.  Marland Kitchen atorvastatin (LIPITOR) 20 MG tablet Take 20 mg by mouth daily.  Marland Kitchen escitalopram (LEXAPRO) 10 MG tablet Take 10 mg by mouth daily.  Marland Kitchen lisinopril-hydrochlorothiazide (ZESTORETIC) 20-12.5 MG tablet Take 1 tablet by mouth daily.  Marland Kitchen oxyCODONE-acetaminophen (PERCOCET) 10-325 MG tablet Take 1 tablet by mouth every 8 (eight) hours as needed for pain.  . pantoprazole (PROTONIX) 40 MG tablet Take 40 mg by mouth daily.  . traZODone (DESYREL) 100 MG tablet Take 100 mg by mouth at bedtime.  . [DISCONTINUED] diazepam (VALIUM) 5 MG tablet Take 1-2 tablets (5-10 mg total) by mouth every 6 (six) hours as needed for muscle spasms.  . [DISCONTINUED] lansoprazole (PREVACID) 15 MG capsule Take 15 mg by mouth every morning.   . [DISCONTINUED]  oxyCODONE 10 MG TABS Take 1 tablet (10 mg total) by mouth every 3 (three) hours as needed for severe pain ((score 7 to 10)).   No facility-administered encounter medications on file as of 04/09/2019.  :   Review of Systems:  Out of a complete 14 point review of systems, all are reviewed and negative with the exception of these symptoms as listed below:  Review of Systems  Neurological:       Pt presents today to discuss his symptoms post laminectomy surgery. He is having trouble walking and  uses a walker. He complains of numbness and tingling. He has had a NCV/EMG at [facility]. Pt has tried gabapentin but he was jittery and sleepy. Pt is right handed.    Objective:  Neurological Exam  Physical Exam Physical Examination:   Vitals:   04/09/19 0834  BP: 102/66  Pulse: 80  Temp: 98.1 F (36.7 C)    General Examination: The patient is a very pleasant 49 y.o. male in no acute distress. He appears well-developed and well-nourished and well groomed.   HEENT: Normocephalic, atraumatic, pupils are equal, round and reactive to light and accommodation. Extraocular tracking is good without limitation to gaze excursion or nystagmus noted. Normal smooth pursuit is noted. Hearing is grossly intact. Face is symmetric with normal facial animation and normal facial sensation. Speech is clear with no dysarthria noted. There is no hypophonia. There is no lip, neck/head, jaw or voice tremor. Neck is supple with full range of passive and active motion. There are no carotid bruits on auscultation. Oropharynx exam reveals: moderate mouth dryness, adequate dental hygiene. Tongue protrudes centrally and palate elevates symmetrically.   Chest: Clear to auscultation without wheezing, rhonchi or crackles noted.  Heart: S1+S2+0, regular and normal without murmurs, rubs or gallops noted.   Abdomen: Soft, non-tender and non-distended with normal bowel sounds appreciated on auscultation.  Extremities: There is no  pitting edema in the distal lower extremities bilaterally. Pedal pulses are intact.  Skin: Warm and dry without trophic changes noted. There are no varicose veins.  Musculoskeletal: exam reveals diffuse leg pain and right hip pain.   Neurologically:  Mental status: The patient is awake, alert and oriented in all 4 spheres. His immediate and remote memory, attention, language skills and fund of knowledge are appropriate. There is no evidence of aphasia, agnosia, apraxia or anomia. Speech is clear with normal prosody and enunciation. Thought process is linear. Mood is normal and affect is normal.  Cranial nerves II - XII are as described above under HEENT exam. In addition: shoulder shrug is normal with equal shoulder height noted. Motor exam: Normal bulk, and tone is noted.  Strength is globally 4 out of 5 without one-sided weakness, significant giveaway weakness noted throughout testing of the upper and lower extremities. No focal or generalized atrophy, no fasciculations, he is wearing short sleeves and shorts.  There is no drift, tremor or rebound. Romberg is not tested as he is not comfortable standing narrow based or able to stand narrow based.  Reflexes are 2+ in the upper extremities, 1-2+ in both knees, 1+ in the ankles and toes are downgoing bilaterally.   On Archimedes spiral drawing he has mild and coarse insecurity with the left hand, no trembling, no trembling with the right hand, handwriting is legible, not tremulous, not micrographic.  He has a mild but intermittent postural tremor in both upper extremities, no action tremor, no intention tremor, no resting tremor, and no resting tremor in the lower extremities. Fine motor skills and coordination: intact with normal finger taps, normal hand movements, normal rapid alternating patting, normal foot taps and normal foot agility, somewhat slow with foot taps but otherwise doable, no decrement in amplitude with finger taps or foot taps.   Cerebellar testing: No dysmetria or intention tremor on finger to nose testing. Heel to shin is unremarkable bilaterally, with the exception of slow movements and R hip pain reported. There is no truncal or gait ataxia.  Sensory exam: intact to light touch, pinprick, vibration, temperature sense in the upper  and lower extremities with the exception of decreased temperature sense in the calf and shin areas bilaterally, better in both feet.    Gait, station and balance: He stands with difficulty and pushes himself up, he is in a wheelchair, does not require assistance to stand up.  He stands wide-based.  He brought a 2 wheeled walker.  He can take a few steps without the walker, walks with his knees slightly bent and slightly bent in the upper body forward.  He takes small and hesitant steps.  He has slight degree of bobbing as he walks, no shuffling, preserved arm swing is noted, no freezing, turns without assistance.  Assessment and Plan:   In summary, [name] is a very pleasant 49 y.o.-year old male with an underlying medical history of hypertension, pneumothorax in the past, low back pain with status post L5-S1 anterior lumbar interbody fusion on 08/12/2018 with residual back pain, on narcotic pain medication, as well as mild obesity, who presents for evaluation of his hand tremor.  He also has a longer standing history of leg pain and leg weakness.  He had evaluation and work-up with Dr. Ermalene Postin in neurology.  I was able to review some of the test results.  The patient had an EMG nerve conduction velocity test either at the end of 2019 or beginning of 2020.  He has a mild postural tremor, no telltale family history of tremor, no evidence of parkinsonism.  We talked about tremor triggers.  He is advised to stay well-hydrated with water, well rested.  He does not sleep very well and has talked to his primary care physician about potentially doing a sleep study.  He is advised to discuss this again with  his primary care physician.  The patient would like to be evaluated locally in [place].  I do not have a good explanation for his weakness in the legs.  He has no lateralizing findings, no evidence of widespread neuropathy.  No obvious fasciculation or focal atrophy was seen.  I suggested we proceed with some additional blood work today and repeat EMG nerve conduction velocity testing of the lower extremities.  He is willing to proceed with this with.  We will keep him posted as to his test results by phone call and follow-up if needed.  I did not suggest any new medication for his mild and intermittent hand tremor.  Of note, he was recently started on Lyrica.  He has a prescription for trazodone for sleep per PCP.  He indicated that it does not help unless he also drinks alcohol at night.  He does not drink every day but is strongly discouraged from combining trazodone and alcohol.  He demonstrated understanding. I answered all his questions today and he was in agreement with pursuing blood testing and EMG nerve conduction velocity testing and with follow-up as needed, we will call with his test results.   Thank you very much for allowing me to participate in the care of this nice patient. If I can be of any further assistance to you please do not hesitate to call me at [phone].  Sincerely,   Jonathan Age, MD, PhD

## 2019-04-09 NOTE — Patient Instructions (Signed)
I do not have a good explanation for your weakness in the legs.  You do have a mild tremor in both hands, I would not recommend additional medication for tremor control.  Tremors can be exacerbated by dehydration, sleep deprivation and caffeine.  Please try to get enough rest at night and talk to your primary care physician at your next appointment about pursuing a sleep study as you have mentioned it before.  He can make a referral to a local sleep specialist and sleep lab.  As discussed, we will proceed with some blood work today and repeat electrical nerve and muscle testing of the lower extremities.  We will call you with the results and follow-up if needed.  Dr. Maple Hudson had done some work-up including labs and imaging tests as well as electrical nerve and muscle testing in 2019 and 2020 also.  We will check blood work today and call you with the test results. We will do an EMG and nerve conduction velocity test, which is an electrical nerve and muscle test, which we will schedule. We will call you with the results.

## 2019-04-13 ENCOUNTER — Telehealth: Payer: Self-pay

## 2019-04-13 NOTE — Progress Notes (Signed)
Please call pt or send My chart message, if applicable.  Labs were benign, one test, vitamin B6 level is pending, we will update if abnormal.

## 2019-04-13 NOTE — Telephone Encounter (Signed)
-----   Message from Huston Foley, MD sent at 04/13/2019  7:54 AM EDT ----- Please call pt or send My chart message, if applicable.  Labs were benign, one test, vitamin B6 level is pending, we will update if abnormal.

## 2019-04-13 NOTE — Telephone Encounter (Signed)
I called pt and discussed this with him. He understands that if his B6 level is abnormal I will call him, otherwise, we will see him at his NCV/EMG appt. Pt verbalized understanding of results. Pt had no questions at this time but was encouraged to call back if questions arise.

## 2019-04-16 LAB — HEAVY METALS PROFILE II, BLOOD
Arsenic: 8 ug/L (ref 2–23)
Cadmium: <0.5 ug/L (ref 0.0–1.2)
Lead, Blood: <1 ug/dL (ref 0–4)
Mercury: <1 ug/L (ref 0.0–14.9)

## 2019-04-16 LAB — VITAMIN B6: Vitamin B6: 21.2 ug/L (ref 5.3–46.7)

## 2019-04-16 LAB — CK: Total CK: 48 U/L — ABNORMAL LOW (ref 49–439)

## 2019-04-16 LAB — B12 AND FOLATE PANEL
Folate: 6.3 ng/mL (ref >3.0)
Vitamin B-12: 939 pg/mL (ref 232–1245)

## 2019-04-16 LAB — TSH: TSH: 1.44 u[IU]/mL (ref 0.450–4.500)

## 2019-05-11 ENCOUNTER — Encounter: Payer: Medicaid Other | Admitting: Neurology

## 2019-06-01 ENCOUNTER — Encounter (INDEPENDENT_AMBULATORY_CARE_PROVIDER_SITE_OTHER): Payer: Medicaid Other | Admitting: Neurology

## 2019-06-01 ENCOUNTER — Other Ambulatory Visit: Payer: Self-pay

## 2019-06-01 ENCOUNTER — Ambulatory Visit: Payer: Medicaid Other | Admitting: Neurology

## 2019-06-01 DIAGNOSIS — R202 Paresthesia of skin: Secondary | ICD-10-CM

## 2019-06-01 DIAGNOSIS — R200 Anesthesia of skin: Secondary | ICD-10-CM | POA: Diagnosis not present

## 2019-06-01 DIAGNOSIS — R251 Tremor, unspecified: Secondary | ICD-10-CM | POA: Diagnosis not present

## 2019-06-01 DIAGNOSIS — R29898 Other symptoms and signs involving the musculoskeletal system: Secondary | ICD-10-CM

## 2019-06-01 DIAGNOSIS — Z0289 Encounter for other administrative examinations: Secondary | ICD-10-CM

## 2019-06-01 NOTE — Procedures (Signed)
Full Name: [name] Gender: Male MRN #: [id number] Date of Birth: 1970-06-28    Visit Date: 06/01/2019 11:13 Age: 49 Years Examining Physician: Marcial Pacas, MD  Referring Physician: Star Age, MD Height: 5 feet 7 inch History:   49 years old male, complains of chronic significant low back pain, recent onset bilateral lower extremity weakness, intermittent bilateral lower extremity tremor when bearing weight  On examination: He has variable effort on motor strength examination, felt bilateral upper and lower extremity motor strength was normal, deep tendon reflex were present and symmetric.  Sensory was intact to light touch, vibratory sensation  Summary of the tests:  Nerve conduction study: Bilateral sural, superficial peroneal sensory responses, bilateral peroneal, tibial motor responses were normal.  Electromyography:  Selected needle examination of bilateral lower crampy muscles bilateral lumbosacral paraspinal muscles were normal.  Conclusion: This is a normal study.  There is no electrodiagnostic evidence of large fiber peripheral neuropathy or bilateral lumbosacral radiculopathy.    ------------------------------- Marcial Pacas, M.D. PhD  [facility] [address], [place], [place] [address] Tel: [phone] Fax: [phone]  Verbal informed consent was obtained from the patient, patient was informed of potential risk of procedure, including bruising, bleeding, hematoma formation, infection, muscle weakness, muscle pain, numbness, among others.        [place]    Nerve / Sites Muscle Latency Ref. Amplitude Ref. Rel Amp Segments Distance Velocity Ref. Area    ms ms mV mV %  cm m/s m/s mVms  L Peroneal - EDB     Ankle EDB 3.6 ?6.5 7.3 ?2.0 100 Ankle - EDB 9   21.9     Fib head EDB 9.9  6.4  87.6 Fib head - Ankle 29 46 ?44 20.6     Pop fossa EDB 11.9  6.1  95.8 Pop fossa - Fib head 10 50 ?44 19.9         Pop fossa - Ankle      R  Peroneal - EDB     Ankle EDB 4.3 ?6.5 5.9 ?2.0 100 Ankle - EDB 9   23.2     Fib head EDB 10.7  5.7  95.6 Fib head - Ankle 29 45 ?44 22.6     Pop fossa EDB 12.9  5.4  94.8 Pop fossa - Fib head 10 46 ?44 21.5         Pop fossa - Ankle      L Tibial - AH     Ankle AH 3.6 ?5.8 5.2 ?4.0 100 Ankle - AH 9   8.5     Pop fossa AH 12.8  2.2  42.9 Pop fossa - Ankle 40 44 ?41 7.3  R Tibial - AH     Ankle AH 3.9 ?5.8 6.6 ?4.0 100 Ankle - AH 9   13.5     Pop fossa AH 13.0  5.1  78 Pop fossa - Ankle 38 42 ?41 11.4             SNC    Nerve / Sites Rec. Site Peak Lat Ref.  Amp Ref. Segments Distance    ms ms V V  cm  L Sural - Ankle (Calf)     Calf Ankle 3.8 ?4.4 9 ?6 Calf - Ankle 14  R Sural - Ankle (Calf)     Calf Ankle 3.4 ?4.4 10 ?6 Calf - Ankle 14  L Superficial peroneal - Ankle     Lat  leg Ankle 3.9 ?4.4 6 ?6 Lat leg - Ankle 14  R Superficial peroneal - Ankle     Lat leg Ankle 4.3 ?4.4 7 ?6 Lat leg - Ankle 14             F  Wave    Nerve F Lat Ref.   ms ms  L Tibial - AH 52.7 ?56.0  R Tibial - AH 53.3 ?56.0         EMG Summary Table    Spontaneous MUAP Recruitment  Muscle IA Fib PSW Fasc Other Amp Dur. Poly Pattern  R. Tibialis anterior Normal None None None _______ Normal Normal Normal Normal  R. Tibialis posterior Normal None None None _______ Normal Normal Normal Normal  R. Peroneus longus Normal None None None _______ Normal Normal Normal Normal  R. Gastrocnemius (Medial head) Normal None None None _______ Normal Normal Normal Normal  R. Vastus lateralis Normal None None None _______ Normal Normal Normal Normal  L. Tibialis anterior Normal None None None _______ Normal Normal Normal Normal  L. Tibialis posterior Normal None None None _______ Normal Normal Normal Normal  L. Peroneus longus Normal None None None _______ Normal Normal Normal Normal  L. Gastrocnemius (Medial head) Normal None None None _______ Normal Normal Normal Normal  L. Vastus lateralis Normal None None None  _______ Normal Normal Normal Normal  R. Lumbar paraspinals (mid) Normal None None None _______ Normal Normal Normal Normal  R. Lumbar paraspinals (low) Normal None None None _______ Normal Normal Normal Normal  L. Lumbar paraspinals (mid) Normal None None None _______ Normal Normal Normal Normal  L. Lumbar paraspinals (low) Normal None None None _______ Normal Normal Normal Normal

## 2019-06-02 ENCOUNTER — Telehealth: Payer: Self-pay

## 2019-06-02 NOTE — Telephone Encounter (Signed)
-----   Message from Huston Foley, MD sent at 06/02/2019  7:10 AM EDT ----- Please call and advise the patient that the recent EMG and nerve conduction velocity test, which is the electrical nerve and muscle test we we performed, was reported as within normal limits. We checked both lower extremities for abnormal electrical discharges in the muscles or nerves and the report suggested normal findings. No further action is required on this test at this time. At this juncture, he can FU with PCP.

## 2019-06-02 NOTE — Telephone Encounter (Signed)
I contacted the pt and advised of the results. Pt verbalized understanding and I have fwd results to Dr. Sol Passer.

## 2019-06-02 NOTE — Progress Notes (Signed)
Please call and advise the patient that the recent EMG and nerve conduction velocity test, which is the electrical nerve and muscle test we we performed, was reported as within normal limits. We checked both lower extremities for abnormal electrical discharges in the muscles or nerves and the report suggested normal findings. No further action is required on this test at this time. At this juncture, he can FU with PCP.

## 2019-06-18 DIAGNOSIS — Z0289 Encounter for other administrative examinations: Secondary | ICD-10-CM

## 2019-08-17 DIAGNOSIS — Z0271 Encounter for disability determination: Secondary | ICD-10-CM

## 2019-09-22 DIAGNOSIS — G4700 Insomnia, unspecified: Secondary | ICD-10-CM

## 2019-09-22 HISTORY — DX: Insomnia, unspecified: G47.00

## 2019-11-11 DIAGNOSIS — R2242 Localized swelling, mass and lump, left lower limb: Secondary | ICD-10-CM | POA: Insufficient documentation

## 2019-11-11 HISTORY — DX: Localized swelling, mass and lump, left lower limb: R22.42

## 2020-01-11 DIAGNOSIS — F1120 Opioid dependence, uncomplicated: Secondary | ICD-10-CM | POA: Insufficient documentation

## 2020-01-11 HISTORY — DX: Opioid dependence, uncomplicated: F11.20

## 2020-01-21 DIAGNOSIS — G894 Chronic pain syndrome: Secondary | ICD-10-CM | POA: Insufficient documentation

## 2020-01-21 HISTORY — DX: Chronic pain syndrome: G89.4

## 2020-05-24 DIAGNOSIS — R0609 Other forms of dyspnea: Secondary | ICD-10-CM | POA: Insufficient documentation

## 2020-05-24 HISTORY — DX: Other forms of dyspnea: R06.09

## 2020-06-22 DIAGNOSIS — G2581 Restless legs syndrome: Secondary | ICD-10-CM

## 2020-06-22 HISTORY — DX: Restless legs syndrome: G25.81

## 2020-07-01 DIAGNOSIS — G4733 Obstructive sleep apnea (adult) (pediatric): Secondary | ICD-10-CM | POA: Insufficient documentation

## 2020-07-01 HISTORY — DX: Obstructive sleep apnea (adult) (pediatric): G47.33

## 2020-11-02 DIAGNOSIS — Z72821 Inadequate sleep hygiene: Secondary | ICD-10-CM | POA: Insufficient documentation

## 2020-11-02 DIAGNOSIS — G4719 Other hypersomnia: Secondary | ICD-10-CM | POA: Insufficient documentation

## 2020-11-02 HISTORY — DX: Other hypersomnia: G47.19

## 2020-11-02 HISTORY — DX: Inadequate sleep hygiene: Z72.821

## 2021-03-04 IMAGING — CR OR LOCAL ABDOMEN
1 series · 1 of 1 positions shown · non-contrast
Comparison: Lumbar radiographs [HOSPITAL] neurosurgery 06/25/2018.

CLINICAL DATA: 48-year-old male being evaluated for instrument
count intraoperatively.

EXAM:
OR LOCAL ABDOMEN

[AP]
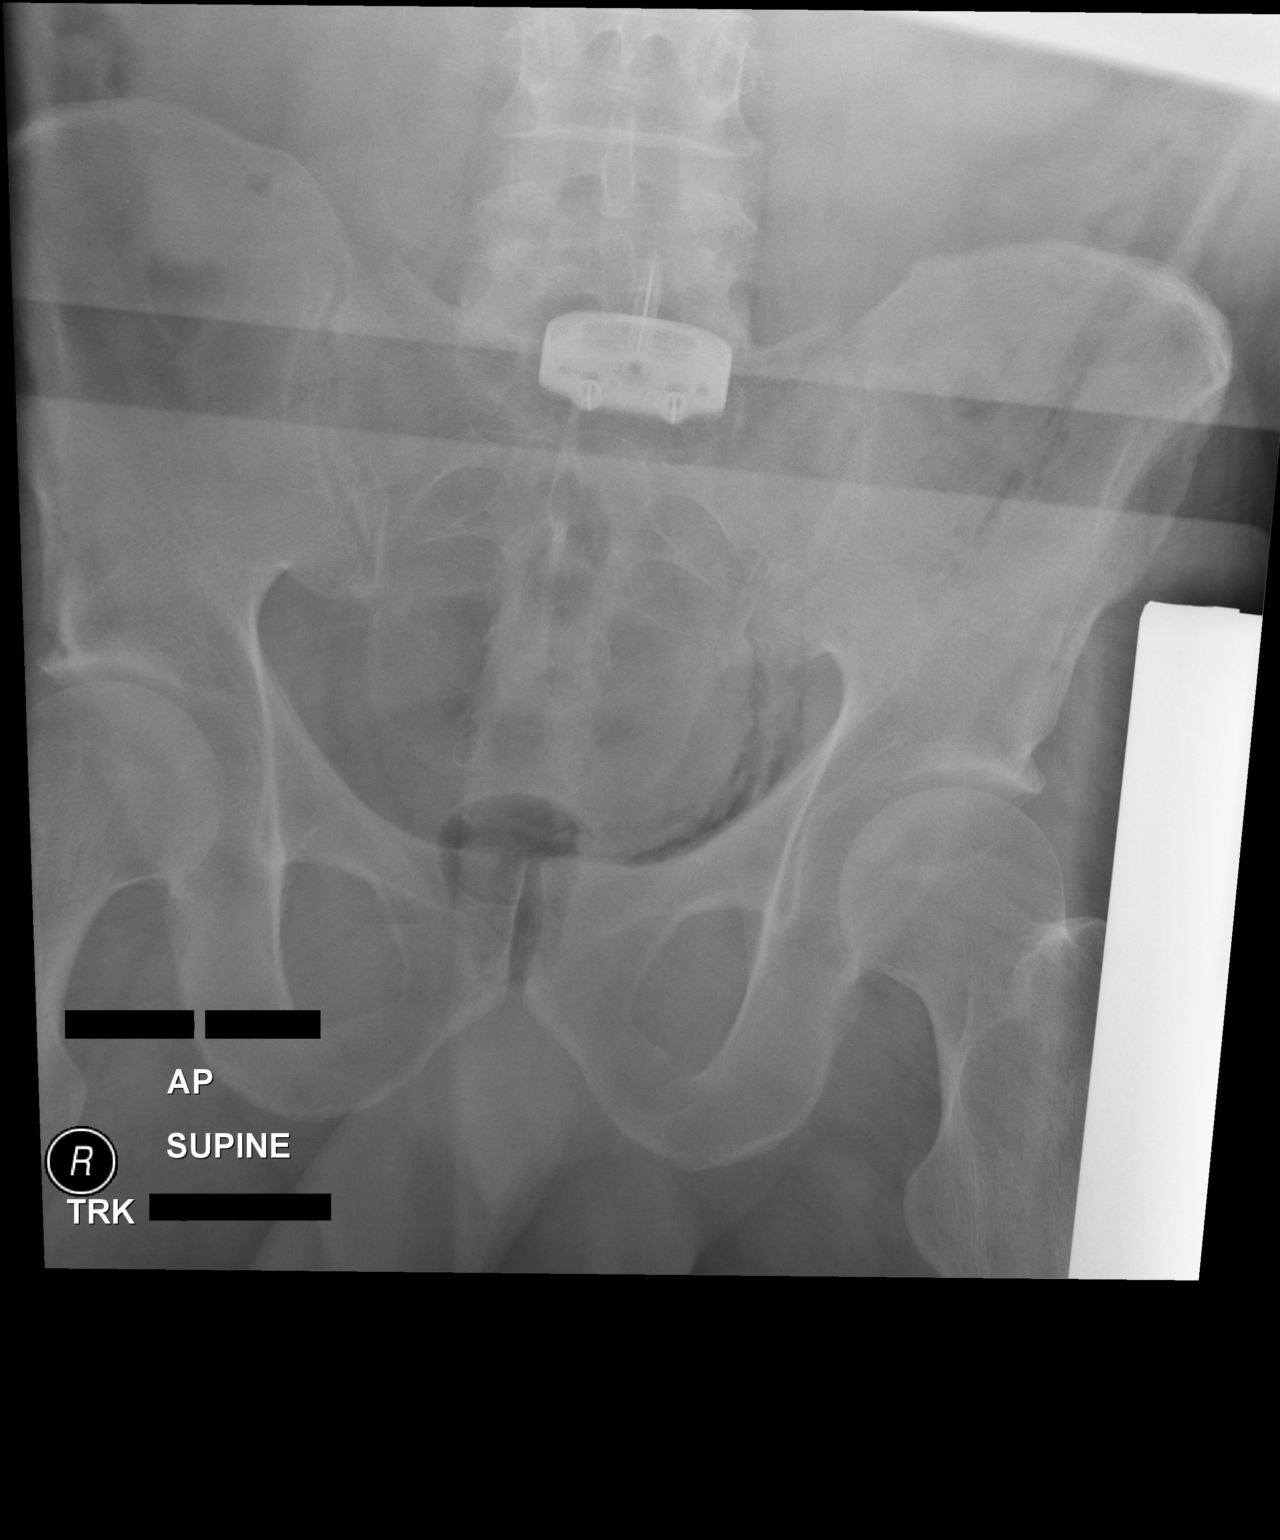

[1 of 1 positions shown; findings below may reference images not displayed]

FINDINGS: Portable AP supine view at 6513 hours. L5-S1 anterior approach
interbody fusion implant identified. No retained surgical
instruments. Negative visible lower abdominal and pelvic visceral
contours.
IMPRESSION: L5-S1 implant with no retained surgical instruments.

This was discussed by telephone with OR staff on 08/12/2018 at [DATE] .

## 2021-03-04 IMAGING — RF LUMBAR SPINE - 2-3 VIEW
1 series · 2 of 2 positions shown · non-contrast
Comparison: None.

CLINICAL DATA: Anterior lumbar interbody fusion at L5-S1.

EXAM:
DG C-ARM 61-120 MIN; LUMBAR SPINE - 2-3 VIEW

[Series 1: run · 2 of 2 slices shown]
[im 1/2]
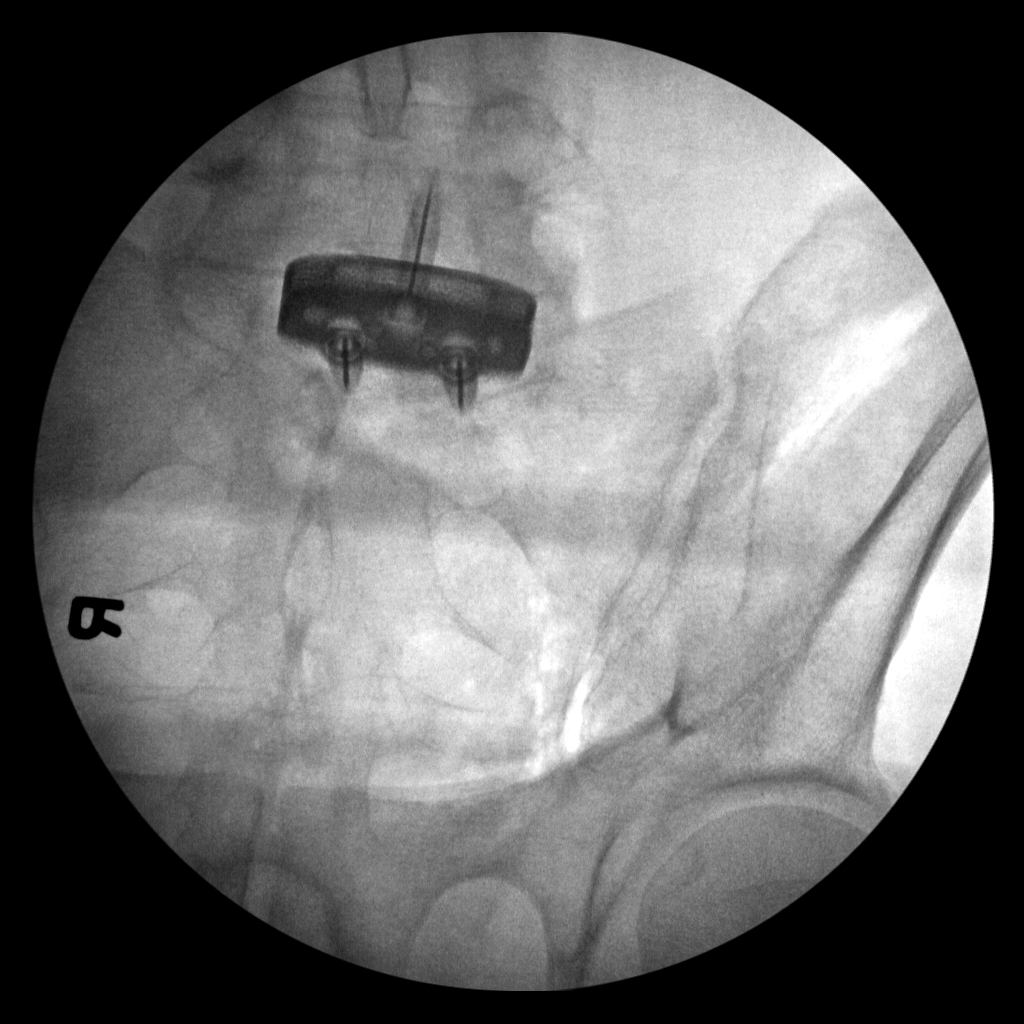
[im 2/2]
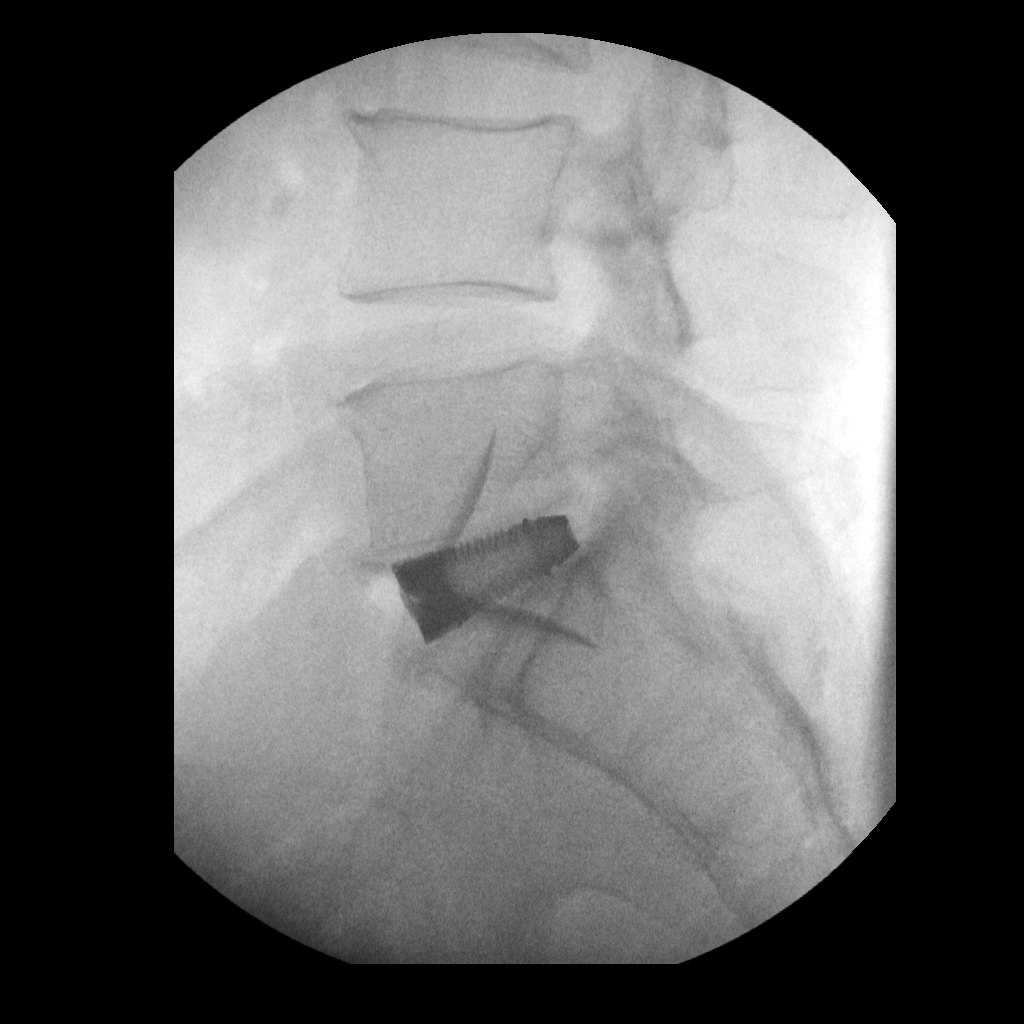

[2 of 2 positions shown; findings below may reference images not displayed]

FINDINGS: Three intraop fluoroscopic images were submitted for review. There
is an anterior interbody fusion seen at L5-S1. Lower time is 23
seconds
IMPRESSION: Intraop views from [facility] at L5-S1.

## 2021-03-22 DIAGNOSIS — R053 Chronic cough: Secondary | ICD-10-CM | POA: Insufficient documentation

## 2021-03-22 DIAGNOSIS — J986 Disorders of diaphragm: Secondary | ICD-10-CM

## 2021-03-22 DIAGNOSIS — R0602 Shortness of breath: Secondary | ICD-10-CM

## 2021-03-22 HISTORY — DX: Disorders of diaphragm: J98.6

## 2021-03-22 HISTORY — DX: Chronic cough: R05.3

## 2021-03-22 HISTORY — DX: Shortness of breath: R06.02

## 2021-07-18 DIAGNOSIS — R39198 Other difficulties with micturition: Secondary | ICD-10-CM

## 2021-07-18 HISTORY — DX: Other difficulties with micturition: R39.198

## 2021-07-21 DIAGNOSIS — N320 Bladder-neck obstruction: Secondary | ICD-10-CM

## 2021-07-21 HISTORY — DX: Bladder-neck obstruction: N32.0

## 2021-08-24 DIAGNOSIS — R491 Aphonia: Secondary | ICD-10-CM | POA: Insufficient documentation

## 2021-08-24 HISTORY — DX: Aphonia: R49.1

## 2021-09-13 DIAGNOSIS — M25571 Pain in right ankle and joints of right foot: Secondary | ICD-10-CM | POA: Diagnosis not present

## 2021-09-13 DIAGNOSIS — M549 Dorsalgia, unspecified: Secondary | ICD-10-CM | POA: Diagnosis not present

## 2021-09-13 DIAGNOSIS — Z79891 Long term (current) use of opiate analgesic: Secondary | ICD-10-CM | POA: Diagnosis not present

## 2021-09-13 DIAGNOSIS — G894 Chronic pain syndrome: Secondary | ICD-10-CM | POA: Diagnosis not present

## 2021-09-20 DIAGNOSIS — H524 Presbyopia: Secondary | ICD-10-CM | POA: Diagnosis not present

## 2021-10-11 DIAGNOSIS — G894 Chronic pain syndrome: Secondary | ICD-10-CM | POA: Diagnosis not present

## 2021-10-11 DIAGNOSIS — M549 Dorsalgia, unspecified: Secondary | ICD-10-CM | POA: Diagnosis not present

## 2021-10-23 ENCOUNTER — Ambulatory Visit: Payer: Medicare Other

## 2021-10-23 ENCOUNTER — Ambulatory Visit (INDEPENDENT_AMBULATORY_CARE_PROVIDER_SITE_OTHER): Payer: Medicare Other | Admitting: Podiatry

## 2021-10-23 ENCOUNTER — Encounter: Payer: Self-pay | Admitting: Podiatry

## 2021-10-23 DIAGNOSIS — M5441 Lumbago with sciatica, right side: Secondary | ICD-10-CM | POA: Diagnosis not present

## 2021-10-23 DIAGNOSIS — M5442 Lumbago with sciatica, left side: Secondary | ICD-10-CM

## 2021-10-23 DIAGNOSIS — M792 Neuralgia and neuritis, unspecified: Secondary | ICD-10-CM | POA: Diagnosis not present

## 2021-10-23 DIAGNOSIS — G8929 Other chronic pain: Secondary | ICD-10-CM | POA: Diagnosis not present

## 2021-10-23 MED ORDER — PREGABALIN 75 MG PO CAPS: 75.0000 mg | ORAL_CAPSULE | Freq: Two times a day (BID) | ORAL | 1 refills | Status: DC

## 2021-10-23 NOTE — Progress Notes (Signed)
Subjective:  Patient ID: [name], male    DOB: August 19, 1970,  MRN: [id number]  Chief Complaint  Patient presents with   Ankle Pain     Chronic ankle pain Referring Provider: Leslee Home    51 y.o. male presents with chronic bilateral lower extremity burning tingling and electric type pains.  He does have a history of having spinal surgery and a metal cage placed in his lumbar spine.  He has previously seen a neurologist 3 years ago and they said that everything was okay based on the testing needed.  However he says that this pain has worsened since that time.  He has previously tried taking gabapentin at the time of the spine surgery but was not able to take it due to an issue with his eyes.  He has never tried Lyrica/pregabalin as far as he is aware.  Past Medical History:  Diagnosis Date   Chronic back pain    Hypertension    Pneumothorax    right   Shortness of breath 03/22/2021    Allergies  Allergen Reactions   Duloxetine Other (See Comments)    shaking   Escitalopram Other (See Comments)    Delayed orgasm on 20 mg   Gabapentin    Olanzapine Other (See Comments)    Weight gain   Valproic Acid Other (See Comments)    Light headed    ROS: Negative except as per HPI above  Objective:  General: AAO x3, NAD  Dermatological: With inspection and palpation of the right and left lower extremities there are no open sores, no preulcerative lesions, no rash or signs of infection present. Nails are of normal length thickness and coloration.   Vascular:  Dorsalis Pedis artery and Posterior Tibial artery pedal pulses are 2/4 bilateral.  Capillary fill time < 3 sec to all digits.   Neruologic: Grossly intact via light touch bilateral. Protective threshold intact to all sites bilateral.  Patient has positive Tinel's and Vallaix sign increase shooting pains with percussion of the tibial nerve at the ankle bilaterally.  Patient describes burning tingling and electric pains  with palpation of the dorsal midfoot bilaterally.  Musculoskeletal: No gross boney pedal deformities bilateral. No pain, crepitus, or limitation noted with foot and ankle range of motion bilateral. Muscular strength 5/5 in all groups tested bilateral.  Gait: Shuffling gait   No images are attached to the encounter.  Assessment:   1. Neuropathic pain   2. Chronic midline low back pain with bilateral sciatica   3. Tendonitis of ankle or foot      Plan:  Patient was evaluated and treated and all questions answered.  #Neuropathic pain bilateral lower extremity, related to prior spinal surgery/ low back pain w/ sciatica -Discussed that the patient's bilateral lower extremity burning tingling pins-and-needles type sensation and pain is very likely related to the issues in his lower back and prior spinal surgery. -I recommend referral back to neurology to see if anything has changed from their perspective as the patient saw them 3 years ago and says the issue is significantly worsened since then.  May benefit from NCV/EMG testing bilateral lower extremity. -He may need further imaging of his lumbar spine to evaluate for any changes there. -Additionally in regards to pain we can try the patient on Lyrica 75 mg take twice daily as needed for neuropathic pain.  I need that this medication can care the same side effects as gabapentin and he should monitor for this if he  does experience that he should discontinue taking it. -Patient will follow-up in approximately 6 to 8 weeks for recheck of neuropathic pain to eval effectiveness of Lyrica  Return in about 6 weeks (around 12/04/2021) for Follow up nerupathy.          Everitt Amber, DPM [facility]

## 2021-10-25 DIAGNOSIS — J40 Bronchitis, not specified as acute or chronic: Secondary | ICD-10-CM | POA: Diagnosis not present

## 2021-10-25 DIAGNOSIS — J329 Chronic sinusitis, unspecified: Secondary | ICD-10-CM | POA: Diagnosis not present

## 2021-10-26 DIAGNOSIS — Z23 Encounter for immunization: Secondary | ICD-10-CM | POA: Diagnosis not present

## 2021-11-13 ENCOUNTER — Telehealth: Payer: Self-pay | Admitting: *Deleted

## 2021-11-13 NOTE — Telephone Encounter (Signed)
Patient is calling and asking that his referral to Neurology be sent to [facility], could not find one in [place]. Have faxed referral/ demo/ office notes per patient's  request

## 2021-11-14 DIAGNOSIS — G894 Chronic pain syndrome: Secondary | ICD-10-CM | POA: Diagnosis not present

## 2021-11-14 DIAGNOSIS — M549 Dorsalgia, unspecified: Secondary | ICD-10-CM | POA: Diagnosis not present

## 2021-11-14 DIAGNOSIS — Z1389 Encounter for screening for other disorder: Secondary | ICD-10-CM | POA: Diagnosis not present

## 2021-11-14 NOTE — Telephone Encounter (Signed)
Referral has already been sent.

## 2021-12-04 ENCOUNTER — Telehealth: Payer: Self-pay | Admitting: *Deleted

## 2021-12-04 ENCOUNTER — Ambulatory Visit: Payer: Medicare Other | Admitting: Podiatry

## 2021-12-04 NOTE — Telephone Encounter (Signed)
Patient is calling for status of a referral to [facility], has not heard anything.I see in referral notes Dr Frances Furbish from [facility] looked over the notes and is recommending that patient be sent to [facility] ([place]and spine,said that patient has been discharged from their practice.-in 2021.

## 2021-12-12 DIAGNOSIS — M549 Dorsalgia, unspecified: Secondary | ICD-10-CM | POA: Diagnosis not present

## 2021-12-12 DIAGNOSIS — G894 Chronic pain syndrome: Secondary | ICD-10-CM | POA: Diagnosis not present

## 2021-12-12 DIAGNOSIS — Z1389 Encounter for screening for other disorder: Secondary | ICD-10-CM | POA: Diagnosis not present

## 2021-12-18 DIAGNOSIS — M545 Low back pain, unspecified: Secondary | ICD-10-CM | POA: Diagnosis not present

## 2021-12-25 ENCOUNTER — Telehealth: Payer: Self-pay | Admitting: Podiatry

## 2021-12-25 NOTE — Telephone Encounter (Signed)
Pt called upset stating he has left messages for office manager and nurse and no one is calling him back. He is having a lot of pain and was referred to [facility] and they said to send pt to [facility] as he was discharged from there practice in 2021.  He does not want to go to [facility] as he had surgery there and was not happy.  Pt states he is in a lot of pain and wants someone to help him and if we cannot let him know and he will find someone else.

## 2022-01-04 DIAGNOSIS — M25571 Pain in right ankle and joints of right foot: Secondary | ICD-10-CM | POA: Diagnosis not present

## 2022-01-04 DIAGNOSIS — M25572 Pain in left ankle and joints of left foot: Secondary | ICD-10-CM | POA: Diagnosis not present

## 2022-01-04 DIAGNOSIS — G629 Polyneuropathy, unspecified: Secondary | ICD-10-CM | POA: Diagnosis not present

## 2022-01-04 DIAGNOSIS — G8929 Other chronic pain: Secondary | ICD-10-CM | POA: Diagnosis not present

## 2022-01-15 DIAGNOSIS — M25571 Pain in right ankle and joints of right foot: Secondary | ICD-10-CM | POA: Diagnosis not present

## 2022-01-15 DIAGNOSIS — G894 Chronic pain syndrome: Secondary | ICD-10-CM | POA: Diagnosis not present

## 2022-01-15 DIAGNOSIS — M549 Dorsalgia, unspecified: Secondary | ICD-10-CM | POA: Diagnosis not present

## 2022-01-15 DIAGNOSIS — Z79891 Long term (current) use of opiate analgesic: Secondary | ICD-10-CM | POA: Diagnosis not present

## 2022-01-17 DIAGNOSIS — G629 Polyneuropathy, unspecified: Secondary | ICD-10-CM | POA: Diagnosis not present

## 2022-01-17 DIAGNOSIS — M779 Enthesopathy, unspecified: Secondary | ICD-10-CM | POA: Diagnosis not present

## 2022-01-17 DIAGNOSIS — M19072 Primary osteoarthritis, left ankle and foot: Secondary | ICD-10-CM | POA: Diagnosis not present

## 2022-01-17 DIAGNOSIS — M25572 Pain in left ankle and joints of left foot: Secondary | ICD-10-CM | POA: Diagnosis not present

## 2022-01-17 DIAGNOSIS — M2140 Flat foot [pes planus] (acquired), unspecified foot: Secondary | ICD-10-CM | POA: Diagnosis not present

## 2022-01-19 DIAGNOSIS — M069 Rheumatoid arthritis, unspecified: Secondary | ICD-10-CM | POA: Diagnosis not present

## 2022-02-13 DIAGNOSIS — G894 Chronic pain syndrome: Secondary | ICD-10-CM | POA: Diagnosis not present

## 2022-02-13 DIAGNOSIS — M25571 Pain in right ankle and joints of right foot: Secondary | ICD-10-CM | POA: Diagnosis not present

## 2022-02-13 DIAGNOSIS — Z79891 Long term (current) use of opiate analgesic: Secondary | ICD-10-CM | POA: Diagnosis not present

## 2022-02-15 DIAGNOSIS — R1013 Epigastric pain: Secondary | ICD-10-CM | POA: Diagnosis not present

## 2022-03-07 DIAGNOSIS — G894 Chronic pain syndrome: Secondary | ICD-10-CM | POA: Diagnosis not present

## 2022-03-07 DIAGNOSIS — M549 Dorsalgia, unspecified: Secondary | ICD-10-CM | POA: Diagnosis not present

## 2022-03-07 DIAGNOSIS — Z1389 Encounter for screening for other disorder: Secondary | ICD-10-CM | POA: Diagnosis not present

## 2022-03-09 DIAGNOSIS — M2140 Flat foot [pes planus] (acquired), unspecified foot: Secondary | ICD-10-CM | POA: Diagnosis not present

## 2022-03-09 DIAGNOSIS — M19072 Primary osteoarthritis, left ankle and foot: Secondary | ICD-10-CM | POA: Diagnosis not present

## 2022-03-09 DIAGNOSIS — M25572 Pain in left ankle and joints of left foot: Secondary | ICD-10-CM | POA: Diagnosis not present

## 2022-03-09 DIAGNOSIS — M7752 Other enthesopathy of left foot: Secondary | ICD-10-CM | POA: Diagnosis not present

## 2022-03-16 DIAGNOSIS — M19072 Primary osteoarthritis, left ankle and foot: Secondary | ICD-10-CM | POA: Diagnosis not present

## 2022-03-16 DIAGNOSIS — S93492A Sprain of other ligament of left ankle, initial encounter: Secondary | ICD-10-CM | POA: Diagnosis not present

## 2022-04-05 DIAGNOSIS — M549 Dorsalgia, unspecified: Secondary | ICD-10-CM | POA: Diagnosis not present

## 2022-04-05 DIAGNOSIS — M7752 Other enthesopathy of left foot: Secondary | ICD-10-CM | POA: Diagnosis not present

## 2022-04-05 DIAGNOSIS — Z79891 Long term (current) use of opiate analgesic: Secondary | ICD-10-CM | POA: Diagnosis not present

## 2022-04-05 DIAGNOSIS — Z1389 Encounter for screening for other disorder: Secondary | ICD-10-CM | POA: Diagnosis not present

## 2022-04-05 DIAGNOSIS — M25571 Pain in right ankle and joints of right foot: Secondary | ICD-10-CM | POA: Diagnosis not present

## 2022-04-05 DIAGNOSIS — G894 Chronic pain syndrome: Secondary | ICD-10-CM | POA: Diagnosis not present

## 2022-05-04 DIAGNOSIS — Z1389 Encounter for screening for other disorder: Secondary | ICD-10-CM | POA: Diagnosis not present

## 2022-05-04 DIAGNOSIS — G894 Chronic pain syndrome: Secondary | ICD-10-CM | POA: Diagnosis not present

## 2022-05-04 DIAGNOSIS — M25571 Pain in right ankle and joints of right foot: Secondary | ICD-10-CM | POA: Diagnosis not present

## 2022-05-04 DIAGNOSIS — Z79891 Long term (current) use of opiate analgesic: Secondary | ICD-10-CM | POA: Diagnosis not present

## 2022-05-17 DIAGNOSIS — M25572 Pain in left ankle and joints of left foot: Secondary | ICD-10-CM | POA: Diagnosis not present

## 2022-05-17 DIAGNOSIS — M19072 Primary osteoarthritis, left ankle and foot: Secondary | ICD-10-CM | POA: Diagnosis not present

## 2022-05-17 DIAGNOSIS — M2140 Flat foot [pes planus] (acquired), unspecified foot: Secondary | ICD-10-CM | POA: Diagnosis not present

## 2022-06-01 DIAGNOSIS — G894 Chronic pain syndrome: Secondary | ICD-10-CM | POA: Diagnosis not present

## 2022-06-01 DIAGNOSIS — Z1389 Encounter for screening for other disorder: Secondary | ICD-10-CM | POA: Diagnosis not present

## 2022-06-01 DIAGNOSIS — M25571 Pain in right ankle and joints of right foot: Secondary | ICD-10-CM | POA: Diagnosis not present

## 2022-06-01 DIAGNOSIS — Z79891 Long term (current) use of opiate analgesic: Secondary | ICD-10-CM | POA: Diagnosis not present

## 2022-06-01 DIAGNOSIS — M549 Dorsalgia, unspecified: Secondary | ICD-10-CM | POA: Diagnosis not present

## 2022-06-14 DIAGNOSIS — Z860100 Personal history of colon polyps, unspecified: Secondary | ICD-10-CM

## 2022-06-14 DIAGNOSIS — Z1211 Encounter for screening for malignant neoplasm of colon: Secondary | ICD-10-CM | POA: Diagnosis not present

## 2022-06-14 HISTORY — DX: Personal history of colon polyps, unspecified: Z86.0100

## 2022-06-18 DIAGNOSIS — M25571 Pain in right ankle and joints of right foot: Secondary | ICD-10-CM | POA: Diagnosis not present

## 2022-06-18 DIAGNOSIS — M25572 Pain in left ankle and joints of left foot: Secondary | ICD-10-CM | POA: Diagnosis not present

## 2022-07-02 DIAGNOSIS — K5730 Diverticulosis of large intestine without perforation or abscess without bleeding: Secondary | ICD-10-CM | POA: Diagnosis not present

## 2022-07-02 DIAGNOSIS — Z1211 Encounter for screening for malignant neoplasm of colon: Secondary | ICD-10-CM | POA: Diagnosis not present

## 2022-07-02 DIAGNOSIS — G894 Chronic pain syndrome: Secondary | ICD-10-CM | POA: Diagnosis not present

## 2022-07-02 DIAGNOSIS — G4733 Obstructive sleep apnea (adult) (pediatric): Secondary | ICD-10-CM | POA: Diagnosis not present

## 2022-07-02 DIAGNOSIS — D123 Benign neoplasm of transverse colon: Secondary | ICD-10-CM | POA: Diagnosis not present

## 2022-07-02 DIAGNOSIS — Z86010 Personal history of colonic polyps: Secondary | ICD-10-CM | POA: Diagnosis not present

## 2022-07-02 DIAGNOSIS — K639 Disease of intestine, unspecified: Secondary | ICD-10-CM | POA: Diagnosis not present

## 2022-07-04 DIAGNOSIS — Z1389 Encounter for screening for other disorder: Secondary | ICD-10-CM | POA: Diagnosis not present

## 2022-07-04 DIAGNOSIS — M549 Dorsalgia, unspecified: Secondary | ICD-10-CM | POA: Diagnosis not present

## 2022-07-04 DIAGNOSIS — G629 Polyneuropathy, unspecified: Secondary | ICD-10-CM | POA: Diagnosis not present

## 2022-07-04 DIAGNOSIS — G894 Chronic pain syndrome: Secondary | ICD-10-CM | POA: Diagnosis not present

## 2022-07-04 DIAGNOSIS — M25571 Pain in right ankle and joints of right foot: Secondary | ICD-10-CM | POA: Diagnosis not present

## 2022-08-08 DIAGNOSIS — Z79891 Long term (current) use of opiate analgesic: Secondary | ICD-10-CM | POA: Diagnosis not present

## 2022-08-08 DIAGNOSIS — M25571 Pain in right ankle and joints of right foot: Secondary | ICD-10-CM | POA: Diagnosis not present

## 2022-08-08 DIAGNOSIS — J986 Disorders of diaphragm: Secondary | ICD-10-CM | POA: Diagnosis not present

## 2022-08-08 DIAGNOSIS — Z902 Acquired absence of lung [part of]: Secondary | ICD-10-CM | POA: Diagnosis not present

## 2022-08-08 DIAGNOSIS — G4733 Obstructive sleep apnea (adult) (pediatric): Secondary | ICD-10-CM | POA: Diagnosis not present

## 2022-08-08 DIAGNOSIS — Z1389 Encounter for screening for other disorder: Secondary | ICD-10-CM | POA: Diagnosis not present

## 2022-08-08 DIAGNOSIS — R0600 Dyspnea, unspecified: Secondary | ICD-10-CM | POA: Diagnosis not present

## 2022-08-08 DIAGNOSIS — Z9889 Other specified postprocedural states: Secondary | ICD-10-CM | POA: Diagnosis not present

## 2022-08-08 DIAGNOSIS — G894 Chronic pain syndrome: Secondary | ICD-10-CM | POA: Diagnosis not present

## 2022-08-08 DIAGNOSIS — M549 Dorsalgia, unspecified: Secondary | ICD-10-CM | POA: Diagnosis not present

## 2022-09-05 DIAGNOSIS — M549 Dorsalgia, unspecified: Secondary | ICD-10-CM | POA: Diagnosis not present

## 2022-09-05 DIAGNOSIS — Z1389 Encounter for screening for other disorder: Secondary | ICD-10-CM | POA: Diagnosis not present

## 2022-09-05 DIAGNOSIS — G894 Chronic pain syndrome: Secondary | ICD-10-CM | POA: Diagnosis not present

## 2022-09-26 DIAGNOSIS — M25571 Pain in right ankle and joints of right foot: Secondary | ICD-10-CM | POA: Diagnosis not present

## 2022-09-26 DIAGNOSIS — M25572 Pain in left ankle and joints of left foot: Secondary | ICD-10-CM | POA: Diagnosis not present

## 2022-10-01 DIAGNOSIS — Z23 Encounter for immunization: Secondary | ICD-10-CM | POA: Diagnosis not present

## 2022-10-04 DIAGNOSIS — Z79891 Long term (current) use of opiate analgesic: Secondary | ICD-10-CM | POA: Diagnosis not present

## 2022-10-04 DIAGNOSIS — M25571 Pain in right ankle and joints of right foot: Secondary | ICD-10-CM | POA: Diagnosis not present

## 2022-10-04 DIAGNOSIS — G894 Chronic pain syndrome: Secondary | ICD-10-CM | POA: Diagnosis not present

## 2022-10-04 DIAGNOSIS — Z1389 Encounter for screening for other disorder: Secondary | ICD-10-CM | POA: Diagnosis not present

## 2022-11-06 DIAGNOSIS — G894 Chronic pain syndrome: Secondary | ICD-10-CM | POA: Diagnosis not present

## 2022-11-06 DIAGNOSIS — M549 Dorsalgia, unspecified: Secondary | ICD-10-CM | POA: Diagnosis not present

## 2022-11-06 DIAGNOSIS — M25571 Pain in right ankle and joints of right foot: Secondary | ICD-10-CM | POA: Diagnosis not present

## 2022-11-06 DIAGNOSIS — Z79891 Long term (current) use of opiate analgesic: Secondary | ICD-10-CM | POA: Diagnosis not present

## 2022-11-26 DIAGNOSIS — M25571 Pain in right ankle and joints of right foot: Secondary | ICD-10-CM | POA: Diagnosis not present

## 2022-11-26 DIAGNOSIS — G5752 Tarsal tunnel syndrome, left lower limb: Secondary | ICD-10-CM | POA: Diagnosis not present

## 2022-11-28 DIAGNOSIS — J329 Chronic sinusitis, unspecified: Secondary | ICD-10-CM | POA: Diagnosis not present

## 2022-11-28 DIAGNOSIS — J40 Bronchitis, not specified as acute or chronic: Secondary | ICD-10-CM | POA: Diagnosis not present

## 2022-11-28 DIAGNOSIS — R0989 Other specified symptoms and signs involving the circulatory and respiratory systems: Secondary | ICD-10-CM | POA: Diagnosis not present

## 2022-12-14 DIAGNOSIS — M549 Dorsalgia, unspecified: Secondary | ICD-10-CM | POA: Diagnosis not present

## 2022-12-14 DIAGNOSIS — M25571 Pain in right ankle and joints of right foot: Secondary | ICD-10-CM | POA: Diagnosis not present

## 2022-12-14 DIAGNOSIS — G894 Chronic pain syndrome: Secondary | ICD-10-CM | POA: Diagnosis not present

## 2022-12-14 DIAGNOSIS — Z79891 Long term (current) use of opiate analgesic: Secondary | ICD-10-CM | POA: Diagnosis not present

## 2022-12-18 DIAGNOSIS — G4733 Obstructive sleep apnea (adult) (pediatric): Secondary | ICD-10-CM | POA: Diagnosis not present

## 2022-12-18 DIAGNOSIS — I10 Essential (primary) hypertension: Secondary | ICD-10-CM | POA: Diagnosis not present

## 2023-01-18 DIAGNOSIS — Z1389 Encounter for screening for other disorder: Secondary | ICD-10-CM | POA: Diagnosis not present

## 2023-01-18 DIAGNOSIS — M549 Dorsalgia, unspecified: Secondary | ICD-10-CM | POA: Diagnosis not present

## 2023-01-18 DIAGNOSIS — M25571 Pain in right ankle and joints of right foot: Secondary | ICD-10-CM | POA: Diagnosis not present

## 2023-01-18 DIAGNOSIS — Z79891 Long term (current) use of opiate analgesic: Secondary | ICD-10-CM | POA: Diagnosis not present

## 2023-01-18 DIAGNOSIS — G894 Chronic pain syndrome: Secondary | ICD-10-CM | POA: Diagnosis not present

## 2023-01-29 DIAGNOSIS — E781 Pure hyperglyceridemia: Secondary | ICD-10-CM | POA: Diagnosis not present

## 2023-01-29 DIAGNOSIS — Z131 Encounter for screening for diabetes mellitus: Secondary | ICD-10-CM | POA: Diagnosis not present

## 2023-01-29 DIAGNOSIS — Z0000 Encounter for general adult medical examination without abnormal findings: Secondary | ICD-10-CM | POA: Diagnosis not present

## 2023-01-29 DIAGNOSIS — Z79899 Other long term (current) drug therapy: Secondary | ICD-10-CM | POA: Diagnosis not present

## 2023-04-25 ENCOUNTER — Encounter: Payer: Self-pay | Admitting: Cardiology

## 2023-04-25 ENCOUNTER — Telehealth (HOSPITAL_BASED_OUTPATIENT_CLINIC_OR_DEPARTMENT_OTHER): Payer: Self-pay

## 2023-04-25 ENCOUNTER — Ambulatory Visit: Attending: Cardiology | Admitting: Cardiology

## 2023-04-25 ENCOUNTER — Ambulatory Visit

## 2023-04-25 VITALS — BP 120/80 | HR 80 | Ht 67.0 in | Wt 233.0 lb

## 2023-04-25 DIAGNOSIS — I10 Essential (primary) hypertension: Secondary | ICD-10-CM | POA: Diagnosis not present

## 2023-04-25 DIAGNOSIS — E782 Mixed hyperlipidemia: Secondary | ICD-10-CM

## 2023-04-25 DIAGNOSIS — Z8709 Personal history of other diseases of the respiratory system: Secondary | ICD-10-CM

## 2023-04-25 DIAGNOSIS — R55 Syncope and collapse: Secondary | ICD-10-CM

## 2023-04-25 DIAGNOSIS — R0609 Other forms of dyspnea: Secondary | ICD-10-CM | POA: Diagnosis not present

## 2023-04-25 HISTORY — DX: Personal history of other diseases of the respiratory system: Z87.09

## 2023-04-25 HISTORY — DX: Syncope and collapse: R55

## 2023-04-25 NOTE — Progress Notes (Signed)
 Cardiology Consultation:    Date:  04/25/2023   ID:  Jule Ser, DOB 1970/10/11, MRN [id number]  PCP:  Annamaria Helling, DO  Cardiologist:  Gypsy Balsam, MD   Referring MD: Olive Bass, MD   Chief Complaint  Patient presents with   Loss of Consciousness   Dizziness    History of Present Illness:    [name] is a 53 y.o. male who is being seen today for the evaluation of passing out at the request of Dough, Doris Cheadle, MD. past medical history significant for pneumothorax which happened after a skiing accident, that required 3 weeks of hospitalization in [place] he did have some procedure done he is not exactly sure what happened he said that portion of his lungs has been resected, also dyslipidemia, was given statin however finished taking the medication thinks his cholesterol is fine, essential hypertension, depression, chronic back problem with some weakening of his lower extremities.  He was referred to [place] because couple days ago he passed out he woke up at 1:00 in the morning he have an urge to go to the restroom to urinate.  Started walking became dizzy was able to make few more steps and then he collapsed when he woke up he was kind of surprised that he was on the floor he tried to get up to go to the bathroom and similar situation happened again.  Eventually he called his wife who is a Engineer, civil (consulting) they decided to call 911.  When EMS arrived EKG been done which showed normal sinus rhythm at rate of 82, right bundle branch block which apparently is chronic, his blood pressure initially was 88/78 with pulse oximetry 99 and heart rate 80, second BP was 132/88.  They wanted to take him to the hospital, he refused.  Then they left he was trying to get up again and he passed out for more time.  Eventually end up going to the bed slept until the morning and he was fine in the morning.  The day before that episodes he ate only breakfast it was very early at 5:00 in the morning.  He  is trying to lose some weight that is why he done it he did not drink much until his friends came to his house and to drink about 6 beers.  Overall he does not exercise much because of chronic back problem.  He is thinking about going to swimming which I think is an excellent idea  Past Medical History:  Diagnosis Date   Chronic back pain    Hypertension    Pneumothorax    right   Shortness of breath 03/22/2021    Past Surgical History:  Procedure Laterality Date   ABDOMINAL EXPOSURE N/A 08/12/2018   Procedure: ABDOMINAL EXPOSURE;  Surgeon: Chuck Hint, MD;  Location: [facility] OR;  Service: Vascular;  Laterality: N/A;   ANTERIOR LUMBAR FUSION N/A 08/12/2018   Procedure: ANTERIOR LUMBAR INTERBODY FUSION-LUMBAR FIVE-SACRAL ONE;  Surgeon: Julio Sicks, MD;  Location: [facility] OR;  Service: Neurosurgery;  Laterality: N/A;   SHOULDER SURGERY Right     Current Medications: Current Meds  Medication Sig   celecoxib (CELEBREX) 200 MG capsule Take 200 mg by mouth daily.   pantoprazole (PROTONIX) 40 MG tablet Take 40 mg by mouth daily.   pramipexole (MIRAPEX) 1 MG tablet Take 1 mg by mouth in the morning and at bedtime.   valsartan-hydrochlorothiazide (DIOVAN-HCT) 160-12.5 MG tablet Take 1 tablet by mouth daily.   [  DISCONTINUED] aspirin EC 81 MG tablet Take 81 mg by mouth daily.   [DISCONTINUED] atorvastatin (LIPITOR) 20 MG tablet Take 20 mg by mouth daily.   [DISCONTINUED] baclofen (LIORESAL) 10 MG tablet Take 5-10 mg by mouth 3 (three) times daily as needed for muscle spasms.   [DISCONTINUED] buPROPion (WELLBUTRIN XL) 150 MG 24 hr tablet Take 150 mg by mouth daily.   [DISCONTINUED] cetirizine (ZYRTEC) 10 MG tablet Take 10 mg by mouth daily.   [DISCONTINUED] escitalopram (LEXAPRO) 10 MG tablet Take 10 mg by mouth daily.   [DISCONTINUED] famotidine (PEPCID) 20 MG tablet Take 20 mg by mouth daily.   [DISCONTINUED] fluticasone (FLONASE) 50 MCG/ACT nasal spray Place 1 spray into both nostrils 2 (two)  times daily.   [DISCONTINUED] furosemide (LASIX) 20 MG tablet Take 40 mg by mouth daily.   [DISCONTINUED] lisinopril-hydrochlorothiazide (ZESTORETIC) 20-12.5 MG tablet Take 1 tablet by mouth daily.   [DISCONTINUED] montelukast (SINGULAIR) 10 MG tablet Take 10 mg by mouth daily.   [DISCONTINUED] oxyCODONE-acetaminophen (PERCOCET) 10-325 MG tablet Take 1 tablet by mouth every 8 (eight) hours as needed for pain.   [DISCONTINUED] pantoprazole (PROTONIX) 40 MG tablet Take 40 mg by mouth daily.   [DISCONTINUED] pregabalin (LYRICA) 75 MG capsule Take 1 capsule (75 mg total) by mouth 2 (two) times daily.   [DISCONTINUED] traZODone (DESYREL) 100 MG tablet Take 100 mg by mouth at bedtime.     Allergies:   Duloxetine, Escitalopram, Gabapentin, Olanzapine, and Valproic acid   Social History   Socioeconomic History   Marital status: Single    Spouse name: Not on file   Number of children: Not on file   Years of education: Not on file   Highest education level: Not on file  Occupational History   Not on file  Tobacco Use   Smoking status: Never   Smokeless tobacco: Never  Vaping Use   Vaping status: Never Used  Substance and Sexual Activity   Alcohol use: Yes    Comment: occasional   Drug use: Yes    Types: Hydrocodone   Sexual activity: Not on file  Other Topics Concern   Not on file  Social History Narrative   Not on file   Social Drivers of Health   Financial Resource Strain: Not on file  Food Insecurity: Not on file  Transportation Needs: Not on file  Physical Activity: Not on file  Stress: Not on file  Social Connections: Unknown (05/23/2021)   [facility]   Social Network    Social Network: Not on file     Family History: The patient's family history is not on file. ROS:   Please see the history of present illness.    All 14 point review of systems negative except as described per history of present illness.  EKGs/Labs/Other Studies  Reviewed:    The following studies were reviewed today:   EKG:  EKG Interpretation Date/Time:  Thursday April 25 2023 14:52:29 EDT Ventricular Rate:  80 PR Interval:  154 QRS Duration:  122 QT Interval:  382 QTC Calculation: 440 R Axis:   -4  Text Interpretation: Sinus rhythm with Fusion complexes Right bundle branch block Abnormal ECG When compared with ECG of 08-Aug-2018 14:19, Fusion complexes are now Present Right bundle branch block is now Present Confirmed by Gypsy Balsam [phone]) on 04/25/2023 2:55:05 PM    Recent Labs: No results found for requested labs within last 365 days.  Recent Lipid Panel No results found for: "CHOL", "  TRIG", "HDL", "CHOLHDL", "VLDL", "LDLCALC", "LDLDIRECT"  Physical Exam:    VS:  BP 120/80 (BP Location: Right Arm, Patient Position: Sitting)   Pulse 80   Ht 5\' 7"  (1.702 m)   Wt 233 lb (105.7 kg)   SpO2 91%   BMI 36.49 kg/m     Wt Readings from Last 3 Encounters:  04/25/23 233 lb (105.7 kg)  08/12/18 201 lb 8 oz (91.4 kg)  08/08/18 201 lb 8 oz (91.4 kg)     GEN:  Well nourished, well developed in no acute distress HEENT: Normal NECK: No JVD; No carotid bruits LYMPHATICS: No lymphadenopathy CARDIAC: RRR, no murmurs, no rubs, no gallops RESPIRATORY:  Clear to auscultation without rales, wheezing or rhonchi  ABDOMEN: Soft, non-tender, non-distended MUSCULOSKELETAL:  No edema; No deformity  SKIN: Warm and dry NEUROLOGIC:  Alert and oriented x 3 PSYCHIATRIC:  Normal affect   ASSESSMENT:    1. Benign hypertension   2. Syncope and collapse   3. Mixed hyperlipidemia   4. Dyspnea on exertion   5. History of pneumothorax right side    PLAN:    In order of problems listed above:  Syncope and collapse.  I suspect this is related to dehydration secondary to the fact that he is trying to lose weight and have not eaten anything since 5:00 in the morning the day before, the only fluids that he have in the afternoon were a few beers that  he drank.  On top of that when EMS came his blood pressure was still low and he felt very woozy and after that every single time he was trying to get up he was developing dizziness and passing out so I suspect orthostatic hypotension with dehydration play significant role here.  However I will ask him to wear Zio patch for 2 weeks to make sure that he does not have any significant arrhythmia, we will schedule him to have echocardiogram to make sure heart is structurally normal. Right bundle branch block of unknown chronicity.  He does have obstructive sleep apnea which can be responsible for this, he also got some lung surgery for pneumothorax done couple years ago although this could explain his right bundle branch block but the most important will be to look at the echocardiogram right ventricle size and function as well as pulmonary pressures. Mixed dyslipidemia I did review his blood test which show still elevation of his cholesterol with LDL of 106.  I will ask him to have a calcium score to determine needs to treat this condition.   Medication Adjustments/Labs and Tests Ordered: Current medicines are reviewed at length with the patient today.  Concerns regarding medicines are outlined above.  Orders Placed This Encounter  Procedures   EKG 12-Lead   No orders of the defined types were placed in this encounter.   Signed, Manfred Seed, MD, [facility]. 04/25/2023 3:26 PM    [facility]

## 2023-04-25 NOTE — Patient Instructions (Addendum)
 Medication Instructions:  Your physician recommends that you continue on your current medications as directed. Please refer to the Current Medication list given to you today.  *If you need a refill on your cardiac medications before your next appointment, please call your pharmacy*   Lab Work: None Ordered If you have labs (blood work) drawn today and your tests are completely normal, you will receive your results only by: MyChart Message (if you have MyChart) OR A paper copy in the mail If you have any lab test that is abnormal or we need to change your treatment, we will call you to review the results.   Testing/Procedures: Your physician has requested that you have an echocardiogram. Echocardiography is a painless test that uses sound waves to create images of your heart. It provides your doctor with information about the size and shape of your heart and how well your heart's chambers and valves are working. This procedure takes approximately one hour. There are no restrictions for this procedure. Please do NOT wear cologne, perfume, aftershave, or lotions (deodorant is allowed). Please arrive 15 minutes prior to your appointment time.  Please note: We ask at that you not bring children with you during ultrasound (echo/ vascular) testing. Due to room size and safety concerns, children are not allowed in the ultrasound rooms during exams. Our front office staff cannot provide observation of children in our lobby area while testing is being conducted. An adult accompanying a patient to their appointment will only be allowed in the ultrasound room at the discretion of the ultrasound technician under special circumstances. We apologize for any inconvenience.    WHY IS MY DOCTOR PRESCRIBING ZIO? The Zio system is proven and trusted by physicians to detect and diagnose irregular heart rhythms -- and has been prescribed to hundreds of thousands of patients.  The FDA has cleared the Zio system to  monitor for many different kinds of irregular heart rhythms. In a study, physicians were able to reach a diagnosis 90% of the time with the Zio system1.  You can wear the Zio monitor -- a small, discreet, comfortable patch -- during your normal day-to-day activity, including while you sleep, shower, and exercise, while it records every single heartbeat for analysis.  1Barrett, P., et al. Comparison of 24 Hour Holter Monitoring Versus 14 Day Novel Adhesive Patch Electrocardiographic Monitoring. American Journal of Medicine, 2014.  ZIO VS. HOLTER MONITORING The Zio monitor can be comfortably worn for up to 14 days. Holter monitors can be worn for 24 to 48 hours, limiting the time to record any irregular heart rhythms you may have. Zio is able to capture data for the 51% of patients who have their first symptom-triggered arrhythmia after 48 hours.1  LIVE WITHOUT RESTRICTIONS The Zio ambulatory cardiac monitor is a small, unobtrusive, and water-resistant patch--you might even forget you're wearing it. The Zio monitor records and stores every beat of your heart, whether you're sleeping, working out, or showering.    We will order CT coronary calcium score. It will cost $99.00 and is not covered by insurance.  Please call to schedule.    [facility] [address] [place], [place] [address] [phone]     Follow-Up: At [facility], you and your health needs are our priority.  As part of our continuing mission to provide you with exceptional heart care, we have created designated Provider Care Teams.  These Care Teams include your primary Cardiologist (physician) and Advanced Practice Providers (APPs -  Physician Assistants and Nurse Practitioners) who all work together to provide you with the care you need, when you need it.  We recommend signing up for the patient portal called "MyChart".  Sign up information is provided on this After Visit Summary.  MyChart is used to connect  with patients for Virtual Visits (Telemedicine).  Patients are able to view lab/test results, encounter notes, upcoming appointments, etc.  Non-urgent messages can be sent to your provider as well.   To learn more about what you can do with MyChart, go to [email or web address].    Your next appointment:   2 month(s)  The format for your next appointment:   In Person  Provider:   Gypsy Balsam, MD    Other Instructions NA

## 2023-05-10 ENCOUNTER — Emergency Department (HOSPITAL_BASED_OUTPATIENT_CLINIC_OR_DEPARTMENT_OTHER)
Admission: EM | Admit: 2023-05-10 | Discharge: 2023-05-11 | Disposition: A | Discharge status: Home / Self Care | Attending: Emergency Medicine | Admitting: Emergency Medicine

## 2023-05-10 ENCOUNTER — Other Ambulatory Visit: Payer: Self-pay

## 2023-05-10 ENCOUNTER — Emergency Department (HOSPITAL_BASED_OUTPATIENT_CLINIC_OR_DEPARTMENT_OTHER)

## 2023-05-10 ENCOUNTER — Ambulatory Visit (HOSPITAL_BASED_OUTPATIENT_CLINIC_OR_DEPARTMENT_OTHER)
Admission: RE | Admit: 2023-05-10 | Discharge: 2023-05-10 | Disposition: A | Discharge status: Home / Self Care | Payer: Self-pay | Source: Ambulatory Visit | Attending: Cardiology | Admitting: Cardiology

## 2023-05-10 ENCOUNTER — Encounter (HOSPITAL_BASED_OUTPATIENT_CLINIC_OR_DEPARTMENT_OTHER): Payer: Self-pay

## 2023-05-10 DIAGNOSIS — J189 Pneumonia, unspecified organism: Secondary | ICD-10-CM

## 2023-05-10 DIAGNOSIS — J181 Lobar pneumonia, unspecified organism: Secondary | ICD-10-CM | POA: Diagnosis not present

## 2023-05-10 DIAGNOSIS — R9389 Abnormal findings on diagnostic imaging of other specified body structures: Secondary | ICD-10-CM | POA: Diagnosis not present

## 2023-05-10 DIAGNOSIS — R911 Solitary pulmonary nodule: Secondary | ICD-10-CM | POA: Insufficient documentation

## 2023-05-10 DIAGNOSIS — E782 Mixed hyperlipidemia: Secondary | ICD-10-CM

## 2023-05-10 DIAGNOSIS — R059 Cough, unspecified: Secondary | ICD-10-CM | POA: Diagnosis present

## 2023-05-10 LAB — CBC
HCT: 41.3 % (ref 39.0–52.0)
Hemoglobin: 15 g/dL (ref 13.0–17.0)
MCH: 31 pg (ref 26.0–34.0)
MCHC: 36.3 g/dL — ABNORMAL HIGH (ref 30.0–36.0)
MCV: 85.3 fL (ref 80.0–100.0)
Platelets: 308 10*3/uL (ref 150–400)
RBC: 4.84 MIL/uL (ref 4.22–5.81)
RDW: 13.1 % (ref 11.5–15.5)
WBC: 6.9 10*3/uL (ref 4.0–10.5)
nRBC: 0 % (ref 0.0–0.2)

## 2023-05-10 LAB — BASIC METABOLIC PANEL WITH GFR
Anion gap: 20 — ABNORMAL HIGH (ref 5–15)
BUN: 15 mg/dL (ref 6–20)
CO2: 19 mmol/L — ABNORMAL LOW (ref 22–32)
Calcium: 9.6 mg/dL (ref 8.9–10.3)
Chloride: 97 mmol/L — ABNORMAL LOW (ref 98–111)
Creatinine, Ser: 1.32 mg/dL — ABNORMAL HIGH (ref 0.61–1.24)
GFR, Estimated: >60 mL/min (ref >60)
Glucose, Bld: 70 mg/dL (ref 70–99)
Potassium: 3.7 mmol/L (ref 3.5–5.1)
Sodium: 136 mmol/L (ref 135–145)

## 2023-05-10 MED ORDER — IOHEXOL 350 MG/ML SOLN
100.0000 mL | Freq: Once | INTRAVENOUS | Status: AC | PRN
  Administered 2023-05-11: 100 mL via INTRAVENOUS

## 2023-05-10 NOTE — ED Triage Notes (Signed)
 Pt POV steady gait- pt sent by pcp to rule out dissection.  Had CT cardiac done today which could not rule out dissection.   Denies CP, SHoB.

## 2023-05-11 MED ORDER — SODIUM CHLORIDE 0.9 % IV BOLUS: 500.0000 mL | Freq: Once | INTRAVENOUS | Status: DC

## 2023-05-11 MED ORDER — AZITHROMYCIN 250 MG PO TABS: 250.0000 mg | ORAL_TABLET | Freq: Every day | ORAL | 0 refills | Status: DC

## 2023-05-11 NOTE — Discharge Instructions (Signed)
 You will need to follow up with your family doctor for recheck for a nodule on your lung.  You will need another CT scan in 6-12 months for recheck.

## 2023-05-11 NOTE — ED Provider Notes (Signed)
 [facility] Provider Note   CSN: [id number] Arrival date & time: 05/10/23  2228     History  Chief Complaint  Patient presents with   Abnormal Lab    [name] is a 53 y.o. male.  The history is provided by the patient.  Abnormal Lab [name] is a 53 y.o. male who presents to the Emergency Department complaining of abnormal CT scan.  He was referred to the emergency department for evaluation of possible dissection on outpatient cardiac CT.  He does not have any acute complaints.  He does report 1 week of cough and sore throat. No fevers or difficulty breathing.    Home Medications Prior to Admission medications   Medication Sig Start Date End Date Taking? Authorizing Provider  azithromycin (ZITHROMAX) 250 MG tablet Take 1 tablet (250 mg total) by mouth daily. Take first 2 tablets together, then 1 every day until finished. 05/11/23  Yes Kelsey Patricia, MD  celecoxib (CELEBREX) 200 MG capsule Take 200 mg by mouth daily.    [provider]  pantoprazole  (PROTONIX ) 40 MG tablet Take 40 mg by mouth daily.    [provider]  pramipexole (MIRAPEX) 1 MG tablet Take 1 mg by mouth in the morning and at bedtime.    [provider]  valsartan-hydrochlorothiazide  (DIOVAN-HCT) 160-12.5 MG tablet Take 1 tablet by mouth daily.    [provider]      Allergies    Duloxetine, Escitalopram, Gabapentin, Olanzapine, and Valproic acid    Review of Systems   Review of Systems  All other systems reviewed and are negative.   Physical Exam Updated Vital Signs BP (!) 141/83 (BP Location: Left Arm)   Pulse 86   Temp 98.1 F (36.7 C) (Oral)   Resp 18   Ht 5\' 7"  (1.702 m)   Wt 105.7 kg   SpO2 95%   BMI 36.49 kg/m  Physical Exam Vitals and nursing note reviewed.  Constitutional:      Appearance: He is well-developed.  HENT:     Head: Normocephalic and atraumatic.  Cardiovascular:     Rate and  Rhythm: Normal rate and regular rhythm.     Heart sounds: No murmur heard. Pulmonary:     Effort: Pulmonary effort is normal. No respiratory distress.     Breath sounds: Normal breath sounds.  Abdominal:     Palpations: Abdomen is soft.     Tenderness: There is no abdominal tenderness. There is no guarding or rebound.  Musculoskeletal:        General: No tenderness.  Skin:    General: Skin is warm and dry.  Neurological:     Mental Status: He is alert and oriented to person, place, and time.  Psychiatric:        Behavior: Behavior normal.     ED Results / Procedures / Treatments   Labs (all labs ordered are listed, but only abnormal results are displayed) Labs Reviewed  CBC - Abnormal; Notable for the following components:      Result Value   MCHC 36.3 (*)    All other components within normal limits  BASIC METABOLIC PANEL WITH GFR - Abnormal; Notable for the following components:   Chloride 97 (*)    CO2 19 (*)    Creatinine, Ser 1.32 (*)    Anion gap 20 (*)    All other components within normal limits    EKG None  Radiology CT Angio  Chest/Abd/Pel for Dissection W and/or Wo Contrast Result Date: 05/11/2023 CLINICAL DATA:  Acute aortic syndrome suspected. Patient sent by PCP to rule out dissection. Cardiac CT done today which could not rule out dissection. EXAM: CT ANGIOGRAPHY CHEST, ABDOMEN AND PELVIS TECHNIQUE: Non-contrast CT of the chest was initially obtained. Multidetector CT imaging through the chest, abdomen and pelvis was performed using the standard protocol during bolus administration of intravenous contrast. Multiplanar reconstructed images and MIPs were obtained and reviewed to evaluate the vascular anatomy. RADIATION DOSE REDUCTION: This exam was performed according to the departmental dose-optimization program which includes automated exposure control, adjustment of the mA and/or kV according to patient size and/or use of iterative reconstruction technique.  CONTRAST:  OMNIPAQUE IOHEXOL 350 MG/ML SOLN COMPARISON:  Same day CT coronary artery CT. FINDINGS: CTA CHEST FINDINGS Cardiovascular: Normal heart size. No pericardial effusion. Normal caliber thoracic aorta without intramural hematoma, penetrating atherosclerotic ulcer, or dissection. Minimal aortic atherosclerotic calcification. Mediastinum/Nodes: Trachea and esophagus are unremarkable. No thoracic adenopathy. Lungs/Pleura: No focal consolidation, pleural effusion, or pneumothorax. Clustered micro nodularity in the posterior left lower lobe (series 6/image 111) and patchy ground-glass opacities in the right middle lobe are likely infectious/inflammatory. Triangular 9 x 5 mm pulmonary nodule along the right minor fissure likely represents an intrapulmonary lymph node. Musculoskeletal: No acute fracture. Review of the MIP images confirms the above findings. CTA ABDOMEN AND PELVIS FINDINGS VASCULAR Normal caliber abdominal aorta without dissection. The mesenteric, renal, and iliac artery branches are widely patent without aneurysm or dissection. Review of the MIP images confirms the above findings. NON-VASCULAR Hepatobiliary: No acute abnormality. Pancreas: Unremarkable. Spleen: Unremarkable. Adrenals/Urinary Tract: Normal adrenal glands. No urinary calculi or hydronephrosis. Unremarkable bladder. Stomach/Bowel: No bowel obstruction or bowel wall thickening. Colonic diverticulosis without diverticulitis. Normal stomach and appendix. Lymphatic: No lymphadenopathy. Reproductive: Unremarkable. Other: No free intraperitoneal fluid or air. Musculoskeletal: No acute fracture.  Postoperative change L5-S1. Review of the MIP images confirms the above findings. IMPRESSION: 1. No acute aortic syndrome. 2. Clustered micro nodularity in the posterior left lower lobe and patchy ground-glass opacities in the right middle lobe are likely infectious/inflammatory. 3. Triangular 9 x 5 mm pulmonary nodule along the right minor  fissure likely represents an intrapulmonary lymph node. Non-contrast chest CT at 6-12 months is recommended. If the nodule is stable at time of repeat CT, then future CT at 18-24 months (from today's scan) is considered optional for low-risk patients, but is recommended for high-risk patients. This recommendation follows the consensus statement: Guidelines for Management of Incidental Pulmonary Nodules Detected on CT Images: From the Fleischner Society 2017; Radiology 2017; 284:228-243. 4. No acute abnormality in the abdomen or pelvis. Electronically Signed   By: Rozell Cornet M.D.   On: 05/11/2023 00:16   CT CARDIAC SCORING (SELF PAY ONLY) Result Date: 05/10/2023 : CLINICAL DATA:  Cardiovascular Disease Risk stratification EXAM: Coronary Calcium Score TECHNIQUE: A gated, non-contrast computed tomography scan of the heart was performed using 3mm slice thickness. Axial images were analyzed on a dedicated workstation. Calcium scoring of the coronary arteries was performed using the Agatston method. FINDINGS: Coronary Calcium Score: Left main: 0 Left anterior descending artery: 0 Left circumflex artery: 0 Right coronary artery: 0 Total: 0 Pericardium: Normal. Ascending Aorta: Normal caliber. Pulmonary artery: Normal caliber Non-cardiac: See separate report from [facility] Radiology. IMPRESSION: Coronary calcium score of 0. In the descending portion of the aortic arch there is possibly flap within the lumen of the aorta. Can not r/o dissection. Will do  chest CT angiogram to r/o dissection. RECOMMENDATIONS: Coronary artery calcium (CAC) score is a strong predictor of incident coronary heart disease (CHD) and provides predictive information beyond traditional risk factors. CAC scoring is reasonable to use in the decision to withhold, postpone, or initiate statin therapy in intermediate-risk or selected borderline-risk asymptomatic adults (age 73-75 years and LDL-C >=70 to <190 mg/dL) who do not have diabetes or  established atherosclerotic cardiovascular disease (ASCVD).* In intermediate-risk (10-year ASCVD risk >=7.5% to <20%) adults or selected borderline-risk (10-year ASCVD risk >=5% to <7.5%) adults in whom a CAC score is measured for the purpose of making a treatment decision the following recommendations have been made: If CAC=0, it is reasonable to withhold statin therapy and reassess in 5 to 10 years, as long as higher risk conditions are absent (diabetes mellitus, family history of premature CHD in first degree relatives (males <55 years; females <65 years), cigarette smoking, or LDL >=190 mg/dL). If CAC is 1 to 99, it is reasonable to initiate statin therapy for patients >=44 years of age. If CAC is >=100 or >=75th percentile, it is reasonable to initiate statin therapy at any age. Cardiology referral should be considered for patients with CAC scores >=400 or >=75th percentile. *2018 AHA/ACC/AACVPR/AAPA/ABC/ACPM/ADA/AGS/APhA/ASPC/NLA/PCNA Guideline on the Management of Blood Cholesterol: A Report of the American College of Cardiology/American Heart Association Task Force on Clinical Practice Guidelines. J Am Coll Cardiol. 2019;73(24):3168-3209. Electronically Signed   By: Ralene Burger M.D.   On: 05/10/2023 17:21    Procedures Procedures    Medications Ordered in ED Medications  sodium chloride  0.9 % bolus 500 mL (500 mLs Intravenous Not Given 05/11/23 0010)  iohexol (OMNIPAQUE) 350 MG/ML injection 100 mL (100 mLs Intravenous Contrast Given 05/11/23 0000)    ED Course/ Medical Decision Making/ A&P Clinical Course as of 05/11/23 0345  Fri May 10, 2023  2245 Received phone call from cardiologist Dr. Gordan Latina earlier this evening. Sent patient for CTA to evaluate for dissection as he noted abnormality to aorta on while reading CT coronary calcium. He reports patient is asymptomatic and can follow up outpatient if negative CT scan.  [TY]    Clinical Course User Index [TY] Rolinda Climes, DO                                  Medical Decision Making Risk Prescription drug management.   Patient referred to the emergency department for abnormal CT scan concerning for possible dissection.  CTA is negative for dissection.  He does have inflammatory changes to bilateral lungs, given his upper respiratory symptoms we will start on antibiotics for possible atypical pneumonia.  He has mild elevation in his creatinine-he has been at a baseball game.  He declines any IV fluids.  He will rehydrate orally.  Also discussed incidental finding of pulmonary nodule and need for outpatient follow-up.  Plan to discharge with outpatient follow-up and return precautions.        Final Clinical Impression(s) / ED Diagnoses Final diagnoses:  Abnormal CT scan  Community acquired pneumonia of left lower lobe of lung  Pulmonary nodule    Rx / DC Orders ED Discharge Orders          Ordered    azithromycin (ZITHROMAX) 250 MG tablet  Daily        05/11/23 0032              Kelsey Patricia, MD 05/11/23 0345

## 2023-05-20 DIAGNOSIS — R55 Syncope and collapse: Secondary | ICD-10-CM

## 2023-05-21 ENCOUNTER — Ambulatory Visit: Payer: Self-pay | Admitting: Emergency Medicine

## 2023-05-22 NOTE — Telephone Encounter (Signed)
-----   Message from Ralene Burger sent at 05/16/2023  4:55 PM EDT ----- Calcium score 0, no evidence of any extracardiac disease

## 2023-05-22 NOTE — Telephone Encounter (Signed)
 Patient notified of results and verbalized understanding.

## 2023-05-29 ENCOUNTER — Ambulatory Visit: Attending: Cardiology

## 2023-05-29 DIAGNOSIS — R55 Syncope and collapse: Secondary | ICD-10-CM

## 2023-05-29 LAB — ECHOCARDIOGRAM COMPLETE: S' Lateral: 2.8 cm

## 2023-06-27 ENCOUNTER — Ambulatory Visit (INDEPENDENT_AMBULATORY_CARE_PROVIDER_SITE_OTHER): Admitting: Neurology

## 2023-06-27 ENCOUNTER — Encounter: Payer: Self-pay | Admitting: Neurology

## 2023-06-27 VITALS — BP 134/82 | HR 97 | Ht 67.0 in | Wt 234.0 lb

## 2023-06-27 DIAGNOSIS — M79672 Pain in left foot: Secondary | ICD-10-CM

## 2023-06-27 DIAGNOSIS — R202 Paresthesia of skin: Secondary | ICD-10-CM

## 2023-06-27 DIAGNOSIS — M79671 Pain in right foot: Secondary | ICD-10-CM

## 2023-06-27 DIAGNOSIS — M542 Cervicalgia: Secondary | ICD-10-CM | POA: Insufficient documentation

## 2023-06-27 DIAGNOSIS — M5442 Lumbago with sciatica, left side: Secondary | ICD-10-CM | POA: Insufficient documentation

## 2023-06-27 HISTORY — DX: Lumbago with sciatica, left side: M54.42

## 2023-06-27 HISTORY — DX: Paresthesia of skin: R20.2

## 2023-06-27 HISTORY — DX: Cervicalgia: M54.2

## 2023-06-27 MED ORDER — ALPRAZOLAM 1 MG PO TABS: ORAL_TABLET | ORAL | 0 refills | Status: DC

## 2023-06-27 NOTE — Progress Notes (Signed)
 Chief Complaint  Patient presents with   New Patient (Initial Visit)    Rm14,  referral for Bilateral foot pain - [facility] EMG eval / Trina Fujita MD [facility]  Pt reports he has been having LE pain post back surgery 2020. He also mentions significant burning in L thigh. Foot pain comes and goes.       ASSESSMENT AND PLAN  [name] is a 53 y.o. male   Chronic slow worsening bilateral feet pain,  Brisk reflex with well-preserved ankle reflex, bilateral Babinski signs,  I do not think he has significant large fiber peripheral neuropathy, Laboratory evaluation to rule out treatable etiology for small fiber neuropathy He does have chronic neck pain, radiating pain along his spine, proceed with MRI of cervical spine to rule out cervical spondylitic myelopathy Xanax as needed due to claustrophobia  Left lateral thigh and paresthesia  Most consistent with left meralgia paresthetica  Continue pain management    DIAGNOSTIC DATA (LABS, IMAGING, TESTING) - I reviewed patient records, labs, notes, testing and imaging myself where available.   MEDICAL HISTORY:  [name] is a 53 year old male, seen in request by his primary care from [facility] medicine at [facility] Dr. Lauri Poot, Gregery Leander for evaluation of foot pain, initial evaluation was on June 27, 2023  History is obtained from the patient and review of electronic medical records. I personally reviewed pertinent available imaging films in PACS.   PMHx of  HTN Restless leg syndrome GERD Chronic low back pain, pain management, oxycodone  10mg  x4  Lumbar decompression, in August 2020,  radiating pain to left lower extremity Right shoulder surgery,  OSA- could not tolerate CPAP   He had a history of lumbar decompression surgery in August 2022, presenting with radiating pain to left lower extremity, but surgery failed to improve his symptoms, he is on disability due to his low back issues  He has constant low back pain,  aching pain to left lower extremity, also bilateral foot pain, is under pain management, taking oxycodone  10 mg 4 times a day, previously tried Cymbalta, gabapentin, Trileptal, could not tolerated due to different reasons  Besides his low back pain, he complains of constant bilateral plantar surface pain, carried a diagnosis of right plantar fasciitis, did improve with stretching and massage, also received multiple repeat injection in the left foot with temporary relief of his symptoms, but have significant bilateral foot pain, difficulty bearing weight, also concerned about left lateral thigh burning paresthesia, denies bowel and bladder incontinence, denies upper extremity symptoms, but he complains of chronic neck pain radiating pain to bilateral shoulder, along his spine  Laboratory evaluation from February 2025 primary care's office, normal TSH 2.07, B12 453, PSA 0.3, RPR was nonreactive, hemoglobin of 15.0, normal CMP creatinine of 1.2, A1c 4.6, LDL 106,   PHYSICAL EXAM:   Vitals:   06/27/23 0816  BP: 134/82  Pulse: 97  Weight: 234 lb (106.1 kg)  Height: 5' 7 (1.702 m)   Body mass index is 36.65 kg/m.  PHYSICAL EXAMNIATION:  Gen: NAD, conversant, well nourised, well groomed                     Cardiovascular: Regular rate rhythm, no peripheral edema, warm, nontender. Eyes: Conjunctivae clear without exudates or hemorrhage Neck: Supple, no carotid bruits. Pulmonary: Clear to auscultation bilaterally   NEUROLOGICAL EXAM:  MENTAL STATUS: Speech/cognition: Obese, depressed looking middle-age male, awake, alert, oriented to history taking and casual conversation CRANIAL  NERVES: CN II: Visual fields are full to confrontation. Pupils are round equal and briskly reactive to light. CN III, IV, VI: extraocular movement are normal. No ptosis. CN V: Facial sensation is intact to light touch CN VII: Face is symmetric with normal eye closure  CN VIII: Hearing is normal to causal  conversation. CN IX, X: Phonation is normal. CN XI: Head turning and shoulder shrug are intact  MOTOR: There is no pronator drift of out-stretched arms. Muscle bulk and tone are normal. Muscle strength is normal.  Bilateral flatfoot  REFLEXES: Reflexes are 2+ and symmetric at the biceps, triceps, knees, and well-preserved ankle reflexes. Plantar responses are extensor bilaterally  SENSORY: Intact to light touch, pinprick and vibratory sensation are intact in fingers and toes.  COORDINATION: There is no trunk or limb dysmetria noted.  GAIT/STANCE: Push-up to get up from seated position, antalgic, bilateral flatfoot, difficulty bearing weight with heel and tiptoe  REVIEW OF SYSTEMS:  Full 14 system review of systems performed and notable only for as above All other review of systems were negative.   ALLERGIES: Allergies  Allergen Reactions   Duloxetine Other (See Comments)    shaking   Escitalopram Other (See Comments)    Delayed orgasm on 20 mg   Gabapentin    Olanzapine Other (See Comments)    Weight gain   Valproic Acid Other (See Comments)    Light headed    HOME MEDICATIONS: Current Outpatient Medications  Medication Sig Dispense Refill   amLODipine (NORVASC) 5 MG tablet Take 5 mg by mouth daily.     celecoxib (CELEBREX) 200 MG capsule Take 200 mg by mouth daily.     Oxycodone  HCl 10 MG TABS 1 tablet Orally every 6 hrs for 30 days     pantoprazole  (PROTONIX ) 40 MG tablet Take 40 mg by mouth daily.     pramipexole (MIRAPEX) 1 MG tablet Take 1 mg by mouth in the morning and at bedtime.     valsartan-hydrochlorothiazide  (DIOVAN-HCT) 160-12.5 MG tablet Take 1 tablet by mouth daily.     No current facility-administered medications for this visit.    PAST MEDICAL HISTORY: Past Medical History:  Diagnosis Date   Chronic back pain    Hypertension    Pneumothorax    right   Shortness of breath 03/22/2021    PAST SURGICAL HISTORY: Past Surgical History:   Procedure Laterality Date   ABDOMINAL EXPOSURE N/A 08/12/2018   Procedure: ABDOMINAL EXPOSURE;  Surgeon: Dannis Dy, MD;  Location[facility] OR;  Service: Vascular;  Laterality: N/A;   ANTERIOR LUMBAR FUSION N/A 08/12/2018   Procedure: ANTERIOR LUMBAR INTERBODY FUSION-LUMBAR FIVE-SACRAL ONE;  Surgeon: Agustina Aldrich, MD;  Location: [facility] OR;  Service: Neurosurgery;  Laterality: N/A;   SHOULDER SURGERY Right     FAMILY HISTORY: History reviewed. No pertinent family history.  SOCIAL HISTORY: Social History   Socioeconomic History   Marital status: Single    Spouse name: Not on file   Number of children: 2   Years of education: Not on file   Highest education level: Not on file  Occupational History   Occupation: disabled  Tobacco Use   Smoking status: Never   Smokeless tobacco: Never  Vaping Use   Vaping status: Never Used  Substance and Sexual Activity   Alcohol use: Yes    Comment: occasional   Drug use: Yes    Types: Hydrocodone    Sexual activity: Not on file  Other Topics Concern   Not on  file  Social History Narrative   Not on file   Social Drivers of Health   Financial Resource Strain: Not on file  Food Insecurity: Not on file  Transportation Needs: Not on file  Physical Activity: Not on file  Stress: Not on file  Social Connections: Unknown (05/23/2021)   [facility]   Social Network    Social Network: Not on file  Intimate Partner Violence: Unknown (04/14/2021)   Received from [facility]   HITS    Physically Hurt: Not on file    Insult or Talk Down To: Not on file    Threaten Physical Harm: Not on file    Scream or Curse: Not on file      Phebe Brasil, M.D. Ph.D.  [facility] [address] [place], [place] [address] Ph: [phone] Fax: [phone]  CC:  Rome Cluck, MD [address] [place],  [place] [address]  [address]ell [address], DO

## 2023-06-28 LAB — CK: Total CK: 59 U/L (ref 41–331)

## 2023-07-01 ENCOUNTER — Ambulatory Visit: Payer: Self-pay | Admitting: Neurology

## 2023-07-01 LAB — MULTIPLE MYELOMA PANEL, SERUM
Albumin SerPl Elph-Mcnc: 3.9 g/dL (ref 2.9–4.4)
Albumin/Glob SerPl: 1.3 (ref 0.7–1.7)
Alpha 1: 0.2 g/dL (ref 0.0–0.4)
Alpha2 Glob SerPl Elph-Mcnc: 0.5 g/dL (ref 0.4–1.0)
B-Globulin SerPl Elph-Mcnc: 1.4 g/dL — ABNORMAL HIGH (ref 0.7–1.3)
Gamma Glob SerPl Elph-Mcnc: 0.9 g/dL (ref 0.4–1.8)
Globulin, Total: 3.1 g/dL (ref 2.2–3.9)
IgA/Immunoglobulin A, Serum: 327 mg/dL (ref 90–386)
IgG (Immunoglobin G), Serum: 864 mg/dL (ref 603–1613)
IgM (Immunoglobulin M), Srm: 62 mg/dL (ref 20–172)
Total Protein: 7 g/dL (ref 6.0–8.5)

## 2023-07-01 LAB — ANA W/REFLEX IF POSITIVE: Anti Nuclear Antibody (ANA): NEGATIVE

## 2023-07-01 LAB — VITAMIN D 25 HYDROXY (VIT D DEFICIENCY, FRACTURES): Vit D, 25-Hydroxy: 30.9 ng/mL (ref 30.0–100.0)

## 2023-07-01 LAB — SEDIMENTATION RATE: Sed Rate: 2 mm/h (ref 0–30)

## 2023-07-01 LAB — C-REACTIVE PROTEIN: CRP: 1 mg/L (ref 0–10)

## 2023-07-02 ENCOUNTER — Telehealth: Payer: Self-pay | Admitting: Neurology

## 2023-07-02 NOTE — Telephone Encounter (Signed)
 no auth required sent to [employer] [phone]

## 2023-07-03 ENCOUNTER — Telehealth: Payer: Self-pay | Admitting: Neurology

## 2023-07-03 MED ORDER — DICLOFENAC SODIUM 1 % EX GEL: CUTANEOUS | 11 refills | Status: DC

## 2023-07-03 NOTE — Telephone Encounter (Signed)
 He is already on polypharmacy treatment, under pain management, reported allergic reaction to multiple neuropathic pain medications including Cymbalta, gabapentin,  I have written diclofenac gel as needed.  He may continue to work with his pcp and pain management for neuropathic pain

## 2023-07-03 NOTE — Telephone Encounter (Signed)
 Returned call to [facility] and he wanted to know what could be done to help him lay on left side due to the Left lateral thigh and paresthesia and he has burning and numbness and would like to know if an rx can be sent in to help with that

## 2023-07-03 NOTE — Telephone Encounter (Signed)
 LVM at 12:27 pm: saw Dr. Onita on 06/27/23 and have a couple of questions need to ask, if someone could call back.

## 2023-07-09 ENCOUNTER — Telehealth: Payer: Self-pay | Admitting: Neurology

## 2023-07-09 NOTE — Telephone Encounter (Signed)
 Called and relayed the following msg: Laboratory evaluation showed:   -- Mild elevation of beta globulin, unknown significance.   --- Rest of the laboratory evaluation showed no significant abnormalities.   Yijun Yan, M.D. Ph.D.   [facility] [address] [place], [place] [address] Phone: [phone] Fax:      [phone]  Pt voiced gratitude and understanding

## 2023-07-09 NOTE — Telephone Encounter (Signed)
 Pt asking to be called with results to blood work

## 2023-07-10 ENCOUNTER — Ambulatory Visit: Attending: Cardiology | Admitting: Cardiology

## 2023-07-10 VITALS — BP 138/80 | HR 73 | Ht 67.0 in | Wt 235.4 lb

## 2023-07-10 DIAGNOSIS — G4733 Obstructive sleep apnea (adult) (pediatric): Secondary | ICD-10-CM | POA: Diagnosis not present

## 2023-07-10 DIAGNOSIS — E782 Mixed hyperlipidemia: Secondary | ICD-10-CM | POA: Diagnosis not present

## 2023-07-10 DIAGNOSIS — R55 Syncope and collapse: Secondary | ICD-10-CM | POA: Diagnosis not present

## 2023-07-10 DIAGNOSIS — Z6831 Body mass index (BMI) 31.0-31.9, adult: Secondary | ICD-10-CM

## 2023-07-10 DIAGNOSIS — I10 Essential (primary) hypertension: Secondary | ICD-10-CM

## 2023-07-10 DIAGNOSIS — R918 Other nonspecific abnormal finding of lung field: Secondary | ICD-10-CM

## 2023-07-10 NOTE — Addendum Note (Signed)
 Addended by: ARLOA PLANAS D on: 07/10/2023 01:16 PM   Modules accepted: Orders

## 2023-07-10 NOTE — Patient Instructions (Signed)
 Medication Instructions:  Your physician recommends that you continue on your current medications as directed. Please refer to the Current Medication list given to you today.  *If you need a refill on your cardiac medications before your next appointment, please call your pharmacy*   Lab Work: None Ordered If you have labs (blood work) drawn today and your tests are completely normal, you will receive your results only by: MyChart Message (if you have MyChart) OR A paper copy in the mail If you have any lab test that is abnormal or we need to change your treatment, we will call you to review the results.   Testing/Procedures: Non-Cardiac CT scanning, (CAT scanning), is a noninvasive, special x-ray that produces cross-sectional images of the body using x-rays and a computer. CT scans help physicians diagnose and treat medical conditions. For some CT exams, a contrast material is used to enhance visibility in the area of the body being studied. CT scans provide greater clarity and reveal more details than regular x-ray exams.    Follow-Up: At [facility], you and your health needs are our priority.  As part of our continuing mission to provide you with exceptional heart care, we have created designated Provider Care Teams.  These Care Teams include your primary Cardiologist (physician) and Advanced Practice Providers (APPs -  Physician Assistants and Nurse Practitioners) who all work together to provide you with the care you need, when you need it.  We recommend signing up for the patient portal called MyChart.  Sign up information is provided on this After Visit Summary.  MyChart is used to connect with patients for Virtual Visits (Telemedicine).  Patients are able to view lab/test results, encounter notes, upcoming appointments, etc.  Non-urgent messages can be sent to your provider as well.   To learn more about what you can do with MyChart, go to [email or web address].    Your next  appointment:   6 month(s)  The format for your next appointment:   In Person  Provider:   Lamar Fitch, MD    Other Instructions Referral to Weight Management Program- They will call for appt

## 2023-07-10 NOTE — Progress Notes (Addendum)
 Cardiology Office Note:    Date:  07/10/2023   ID:  [name], DOB [birth year], MRN [id number]  PCP:  Jonathan Dene BROCKS, DO  Cardiologist:  Jonathan Fitch, MD    Referring MD: Jonathan Dene BROCKS, DO   Chief Complaint  Patient presents with   Follow-up    History of Present Illness:    [name] is a 53 y.o. male past medical history significant for essential hypertension, history of pneumothorax spontaneous, he was referred to us  because of episode of syncope.  Look like that was related to dehydration lack of food and drinking.  We did monitor which shows some nonsustained supraventricular tachycardia which is asymptomatic, he also had echocardiogram that basically showed showed structurally normal heart, coronary calcium score was 0.  Comes today to my office to follow-up on that.  Overall he is doing fine.  He denies have any chest pain tightness squeezing pressure burning chest.  Aggressively trying to lose some weight but is disappointed because of lack of successes.  Past Medical History:  Diagnosis Date   Chronic back pain    Hypertension    Pneumothorax    right   Shortness of breath 03/22/2021    Past Surgical History:  Procedure Laterality Date   ABDOMINAL EXPOSURE N/A 08/12/2018   Procedure: ABDOMINAL EXPOSURE;  Surgeon: Eliza Lonni RAMAN, MD;  Location: [facility] OR;  Service: Vascular;  Laterality: N/A;   ANTERIOR LUMBAR FUSION N/A 08/12/2018   Procedure: ANTERIOR LUMBAR INTERBODY FUSION-LUMBAR FIVE-SACRAL ONE;  Surgeon: Louis Shove, MD;  Location: [facility] OR;  Service: Neurosurgery;  Laterality: N/A;   SHOULDER SURGERY Right     Current Medications: Current Meds  Medication Sig   ALPRAZolam  (XANAX ) 1 MG tablet Take 1-2 tablets 30 minutes prior to MRI, may repeat once as needed. Must have driver.   amLODipine (NORVASC) 5 MG tablet Take 5 mg by mouth daily.   celecoxib (CELEBREX) 200 MG capsule Take 200 mg by mouth daily.   diclofenac  Sodium (VOLTAREN ) 1 % GEL  1 gram 4 times a day as needed.   Oxycodone  HCl 10 MG TABS 1 tablet Orally every 6 hrs for 30 days   pantoprazole  (PROTONIX ) 40 MG tablet Take 40 mg by mouth daily.   pramipexole (MIRAPEX) 1 MG tablet Take 1 mg by mouth in the morning and at bedtime.   valsartan-hydrochlorothiazide  (DIOVAN-HCT) 160-12.5 MG tablet Take 1 tablet by mouth daily.     Allergies:   Divalproex sodium, Duloxetine, Escitalopram, Gabapentin, Olanzapine, and Valproic acid   Social History   Socioeconomic History   Marital status: Single    Spouse name: Not on file   Number of children: 2   Years of education: Not on file   Highest education level: Not on file  Occupational History   Occupation: disabled  Tobacco Use   Smoking status: Never   Smokeless tobacco: Never  Vaping Use   Vaping status: Never Used  Substance and Sexual Activity   Alcohol use: Yes    Comment: occasional   Drug use: Yes    Types: Hydrocodone    Sexual activity: Not on file  Other Topics Concern   Not on file  Social History Narrative   Not on file   Social Drivers of Health   Financial Resource Strain: Not on file  Food Insecurity: Not on file  Transportation Needs: Not on file  Physical Activity: Not on file  Stress: Not on file  Social Connections: Unknown (05/23/2021)   Received  [facility]   Social Network    Social Network: Not on file     Family History: The patient's family history is not on file. ROS:   Please see the history of present illness.    All 14 point review of systems negative except as described per history of present illness  EKGs/Labs/Other Studies Reviewed:         Recent Labs: 05/10/2023: BUN 15; Creatinine, Ser 1.32; Hemoglobin 15.0; Platelets 308; Potassium 3.7; Sodium 136  Recent Lipid Panel No results found for: CHOL, TRIG, HDL, CHOLHDL, VLDL, LDLCALC, LDLDIRECT  Physical Exam:    VS:  BP 138/80 (BP Location: Right Arm, Patient Position: Sitting)   Pulse 73    Ht 5' 7 (1.702 m)   Wt 235 lb 6.4 oz (106.8 kg)   SpO2 98%   BMI 36.87 kg/m     Wt Readings from Last 3 Encounters:  07/10/23 235 lb 6.4 oz (106.8 kg)  06/27/23 234 lb (106.1 kg)  05/10/23 233 lb (105.7 kg)     GEN:  Well nourished, well developed in no acute distress HEENT: Normal NECK: No JVD; No carotid bruits LYMPHATICS: No lymphadenopathy CARDIAC: RRR, no murmurs, no rubs, no gallops RESPIRATORY:  Clear to auscultation without rales, wheezing or rhonchi  ABDOMEN: Soft, non-tender, non-distended MUSCULOSKELETAL:  No edema; No deformity  SKIN: Warm and dry LOWER EXTREMITIES: no swelling NEUROLOGIC:  Alert and oriented x 3 PSYCHIATRIC:  Normal affect   ASSESSMENT:    1. Essential hypertension   2. Obstructive sleep apnea syndrome   3. Mixed hyperlipidemia   4. Syncope and collapse    PLAN:    In order of problems listed above:  Essential hypertension blood pressure well-controlled today continue present management. Dyslipidemia total cholesterol 218 HDL 49 but his calcium score is 0.  Will continue risk modifications for now.  I had a long discussion with him about weight weight loss.  He is willing to talk to our weight management program. Syncope collapse ask him to stay well-hydrated. Obstructive sleep apnea follow-up by internal medicine team. Supraventricular tachycardia asymptomatic short episodes continue monitoring  He was recently found to have low testosterone he would like to take testosterone supplementation which from cardiac standpoint review should not be a problem   Medication Adjustments/Labs and Tests Ordered: Current medicines are reviewed at length with the patient today.  Concerns regarding medicines are outlined above.  No orders of the defined types were placed in this encounter.  Medication changes: No orders of the defined types were placed in this encounter.   Signed, Jonathan DOROTHA Fitch, MD, [facility] 07/10/2023 1:09 PM    [facility]

## 2023-07-11 ENCOUNTER — Encounter (INDEPENDENT_AMBULATORY_CARE_PROVIDER_SITE_OTHER): Payer: Self-pay

## 2023-07-15 ENCOUNTER — Telehealth: Payer: Self-pay | Admitting: Neurology

## 2023-07-15 DIAGNOSIS — R202 Paresthesia of skin: Secondary | ICD-10-CM

## 2023-07-15 DIAGNOSIS — M79671 Pain in right foot: Secondary | ICD-10-CM

## 2023-07-15 DIAGNOSIS — M5442 Lumbago with sciatica, left side: Secondary | ICD-10-CM

## 2023-07-15 MED ORDER — DICLOFENAC SODIUM 1 % EX GEL: CUTANEOUS | 11 refills | Status: DC

## 2023-07-15 MED ORDER — LIDOCAINE-PRILOCAINE 2.5-2.5 % EX CREA: TOPICAL_CREAM | CUTANEOUS | 11 refills | Status: AC

## 2023-07-15 NOTE — Telephone Encounter (Signed)
 Patient said want to speak with a manager or head nurse because no one has told me nothing really about my blood work results.  Spoke with nurse about blood work results. Nurse said the name of something on the results but really did not explain in detail.  Would like a call back today. Contact information: [phone]  Emailed message to [employer], Duwaine Russell, NP and Dr Onita.

## 2023-07-15 NOTE — Telephone Encounter (Signed)
 I called patient, explaining laboratory findings, the main abnormality is mild elevated creatinine, encouraged him to increase water intake  He complains of left lateral thigh paresthesia, already taking oxycodone , Celebrex, under pain management, he could not tolerate gabapentin, Lyrica  in the past,  Decided EMLA  gel may mix together with diclofenac  gel as needed Meds ordered this encounter  Medications   lidocaine -prilocaine  (EMLA ) cream    Sig: 1 gram tid prn    Dispense:  30 g    Refill:  11   diclofenac  Sodium (VOLTAREN ) 1 % GEL    Sig: 1gram tid prn    Dispense:  120 g    Refill:  11

## 2023-07-25 ENCOUNTER — Inpatient Hospital Stay (HOSPITAL_BASED_OUTPATIENT_CLINIC_OR_DEPARTMENT_OTHER): Admission: RE | Admit: 2023-07-25 | Source: Ambulatory Visit | Admitting: Radiology

## 2023-08-01 ENCOUNTER — Ambulatory Visit (HOSPITAL_BASED_OUTPATIENT_CLINIC_OR_DEPARTMENT_OTHER)
Admission: RE | Admit: 2023-08-01 | Discharge: 2023-08-01 | Disposition: A | Discharge status: Home / Self Care | Source: Ambulatory Visit | Attending: Neurology | Admitting: Neurology

## 2023-08-01 DIAGNOSIS — M5442 Lumbago with sciatica, left side: Secondary | ICD-10-CM

## 2023-08-01 DIAGNOSIS — M542 Cervicalgia: Secondary | ICD-10-CM | POA: Diagnosis not present

## 2023-08-01 DIAGNOSIS — R202 Paresthesia of skin: Secondary | ICD-10-CM | POA: Diagnosis not present

## 2023-08-01 DIAGNOSIS — M79671 Pain in right foot: Secondary | ICD-10-CM

## 2023-08-08 ENCOUNTER — Telehealth: Payer: Self-pay | Admitting: Neurology

## 2023-08-08 ENCOUNTER — Encounter: Payer: Self-pay | Admitting: Neurology

## 2023-08-08 NOTE — Telephone Encounter (Signed)
 Pt called to follow up on MRI results ,. PT states  he would like call once Mri Results are in .

## 2023-08-08 NOTE — Telephone Encounter (Signed)
 Called Pt Phone spoke to wife inform Wife that  MRI Result are not in yet  but once MD review those results  , Pt will get get a call .

## 2023-08-15 ENCOUNTER — Institutional Professional Consult (permissible substitution) (INDEPENDENT_AMBULATORY_CARE_PROVIDER_SITE_OTHER): Admitting: Family Medicine

## 2023-09-03 ENCOUNTER — Encounter: Payer: Self-pay | Admitting: Cardiology

## 2023-11-05 ENCOUNTER — Telehealth: Payer: Self-pay

## 2023-11-05 ENCOUNTER — Encounter: Payer: Self-pay | Admitting: Cardiology

## 2023-11-05 ENCOUNTER — Telehealth: Payer: Self-pay | Admitting: Cardiology

## 2023-11-05 DIAGNOSIS — I10 Essential (primary) hypertension: Secondary | ICD-10-CM | POA: Insufficient documentation

## 2023-11-05 DIAGNOSIS — G8929 Other chronic pain: Secondary | ICD-10-CM | POA: Insufficient documentation

## 2023-11-05 DIAGNOSIS — J939 Pneumothorax, unspecified: Secondary | ICD-10-CM | POA: Insufficient documentation

## 2023-11-05 NOTE — Telephone Encounter (Signed)
 LVM to call regarding message.

## 2023-11-05 NOTE — Telephone Encounter (Signed)
 Pt c/o Shortness Of Breath: STAT if SOB developed within the last 24 hours or pt is noticeably SOB on the phone  1. Are you currently SOB (can you hear that pt is SOB on the phone)?   Yes  2. How long have you been experiencing SOB?   Ongoing for a month  3. Are you SOB when sitting or when up moving around?   Sitting and up and moving around  4. Are you currently experiencing any other symptoms?   Nausea and lightheadedness when going to stand   Patient stated he had a chest x-ray last week but is concerned he is still having the SOB.

## 2023-11-05 NOTE — Telephone Encounter (Signed)
 Spoke with pt. He stated that he continues to be short of breath. Sent message to front desk to make appt for pt to be seen. Advised to go to ER or urgent care if the symptoms worsen.

## 2023-11-06 ENCOUNTER — Ambulatory Visit

## 2023-11-12 ENCOUNTER — Encounter: Payer: Self-pay | Admitting: Cardiology

## 2024-01-10 ENCOUNTER — Other Ambulatory Visit (HOSPITAL_BASED_OUTPATIENT_CLINIC_OR_DEPARTMENT_OTHER): Admitting: Radiology

## 2024-01-15 ENCOUNTER — Other Ambulatory Visit (HOSPITAL_BASED_OUTPATIENT_CLINIC_OR_DEPARTMENT_OTHER): Admitting: Radiology

## 2024-01-15 ENCOUNTER — Ambulatory Visit: Admitting: Cardiology

## 2024-01-22 ENCOUNTER — Ambulatory Visit (HOSPITAL_BASED_OUTPATIENT_CLINIC_OR_DEPARTMENT_OTHER)
Admission: RE | Admit: 2024-01-22 | Discharge: 2024-01-22 | Disposition: A | Discharge status: Home / Self Care | Source: Ambulatory Visit | Attending: Cardiology | Admitting: Cardiology

## 2024-01-22 DIAGNOSIS — R918 Other nonspecific abnormal finding of lung field: Secondary | ICD-10-CM

## 2024-01-24 ENCOUNTER — Ambulatory Visit: Payer: Self-pay | Admitting: Cardiology

## 2024-01-27 ENCOUNTER — Telehealth: Payer: Self-pay

## 2024-01-27 NOTE — Telephone Encounter (Signed)
 Pt viewed Chest CT results on My Chart per Dr. Karry note. Routed to PCP.

## 2024-01-31 ENCOUNTER — Ambulatory Visit: Attending: Cardiology | Admitting: Cardiology

## 2024-01-31 ENCOUNTER — Encounter: Payer: Self-pay | Admitting: Cardiology

## 2024-01-31 VITALS — BP 104/70 | HR 78 | Ht 67.0 in | Wt 221.0 lb

## 2024-01-31 DIAGNOSIS — E782 Mixed hyperlipidemia: Secondary | ICD-10-CM

## 2024-01-31 DIAGNOSIS — G4733 Obstructive sleep apnea (adult) (pediatric): Secondary | ICD-10-CM | POA: Diagnosis not present

## 2024-01-31 DIAGNOSIS — I10 Essential (primary) hypertension: Secondary | ICD-10-CM

## 2024-01-31 NOTE — Progress Notes (Signed)
 " Cardiology Office Note:    Date:  01/31/2024   ID:  [name], DOB 1970-08-11, MRN [id number]  PCP:  [name]., MD  Cardiologist:  Lamar Fitch, MD    Referring MD: [name]., MD   Chief Complaint  Patient presents with   Follow-up  Doing fine  History of Present Illness:     [name] is a 54 y.o. male past medical history significant for essential hypertension, history of spontaneous pneumothorax he was referred to us  because of episode of syncope however look like it was related to dehydration or lack of food and some drinking.  He did wear a monitor which showed some nonsustained supraventricular tachycardia asymptomatic echocardiogram showed normal heart coronary calcium score 0 comes today to discuss that.  Overall doing well.  He is on medication to lose weight he lost about 20 pounds feeling much better started exercising on the regular basis 3 times a week walking on the treadmill which I encouraged to continue  Past Medical History:  Diagnosis Date   Anxiety 02/10/2019   Formatting of this note might be different from the original.  2021  Formatting of this note might be different from the original.  Formatting of this note might be different from the original.  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  07/21/2021   08/10/2021 : renal sono normal     Body mass index (BMI) 31.0-31.9, adult 06/11/2018   Chronic back pain    Chronic pain syndrome 01/21/2020   Complication of surgical procedure 02/19/2019   Degeneration of lumbosacral intervertebral disc 08/12/2018   Difficulty in urination 07/18/2021   Formatting of this note might be different from the original.  07/18/2021     Dyspnea on exertion 05/24/2020   Formatting of this note might be different from the original.  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  Right side- chest fluoroscopy 12/15/2020  PFT 12/15/2020 ratio 77% FEV1 83% FVC 81% DLCO 87%     Essential hypertension 04/15/2018   Excessive daytime sleepiness 11/02/2020   Foraminal stenosis of lumbar region 05/15/2018   Gastroesophageal reflux disease 02/10/2019   Formatting of this note might be different from the original.  Chronic PPI, failed H2B     History of colonic polyps 06/14/2022   History of pneumothorax right side 04/25/2023   Hypertension    Inadequate sleep hygiene 11/02/2020   Insomnia 09/22/2019   Left lower quadrant abdominal pain 03/19/2019   Loss of voice 08/24/2021   Low back pain with left-sided sciatica 06/27/2023   Mass of left foot 11/11/2019   Formatting of this note might be different from the original.  Formatting of this note might be different from the original.  11/11/2019: POD  Formatting of this note might be different from the original.  11/11/2019: POD     Mixed hyperlipidemia 04/15/2018   Formatting of this note might be different from the original.  2021: initial statin  Formatting of this note might be different from the original.  Formatting of this note might be different from the original.  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  2021  Formatting of this note might be different from the original.  Formatting of this note might be different from the original.  2021     Neck pain 06/27/2023   Obstructive sleep apnea syndrome 07/01/2020   Formatting of this note is different from the original.  09/15/2021-intolerant of machine, discontinued and turned machine back  Split-night 01/24/2021 AHI 77; BiPAP 19/15  PSG 06/07/2020, CPAP 10     Opioid dependence (HCC)  01/11/2020   Pain in limb 10/10/2017   Paresthesia 06/27/2023   Persistent cough 03/22/2021   Pneumothorax    right   Restless legs syndrome 06/22/2020   Shortness of breath 03/22/2021   Syncope and collapse 04/25/2023   Weakness of both lower extremities 10/10/2017   Formatting of this note might be different from the original.  2021: NEURO eval, EMG/NCV normal      Past Surgical History:  Procedure Laterality Date   ABDOMINAL EXPOSURE N/A 08/12/2018   Procedure: ABDOMINAL EXPOSURE;  Surgeon: Eliza Lonni RAMAN, MD;  Location: [facility] OR;  Service: Vascular;  Laterality: N/A;   ANTERIOR LUMBAR FUSION N/A 08/12/2018   Procedure: ANTERIOR LUMBAR INTERBODY FUSION-LUMBAR FIVE-SACRAL ONE;  Surgeon: Louis Shove, MD;  Location: [facility] OR;  Service: Neurosurgery;  Laterality: N/A;   SHOULDER SURGERY Right     Current Medications: Active Medications[1]   Allergies:   Divalproex sodium, Duloxetine, Escitalopram, Gabapentin, Olanzapine, and Valproic acid   Social History   Socioeconomic History   Marital status: Single    Spouse name: Not on file   Number of children: 2   Years of education: Not on file   Highest education level: Not on file  Occupational History   Occupation: disabled  Tobacco Use   Smoking status: Never   Smokeless tobacco: Never  Vaping Use   Vaping status: Never Used  Substance and Sexual Activity   Alcohol use: Yes    Comment: occasional   Drug use: Yes    Types: Hydrocodone    Sexual activity: Not on file  Other Topics Concern   Not on file  Social History Narrative   Not on file   Social Drivers of Health   Tobacco Use: Low Risk (01/31/2024)   Patient History    Smoking Tobacco Use: Never    Smokeless Tobacco Use: Never    Passive Exposure: Not on file  Financial Resource Strain: Not on file  Food Insecurity: Not on file  Transportation Needs: Not on file  Physical Activity: Not on file  Stress: Not on file  Social Connections: Unknown (05/23/2021)    Received from [facility]   Social Network    Social Network: Not on file  Depression (PHQ2-9): Not on file  Alcohol Screen: Not on file  Housing: Not on file  Utilities: Not on file  Health Literacy: Not on file     Family History: The patient's family history is not on file. ROS:   Please see the history of present illness.    All 14 point review of systems negative except as described per history of present illness  EKGs/Labs/Other Studies Reviewed:         Recent Labs: 05/10/2023: BUN 15; Creatinine, Ser 1.32; Hemoglobin 15.0; Platelets 308; Potassium 3.7; Sodium 136  Recent Lipid Panel No results found for: CHOL, TRIG, HDL, CHOLHDL, VLDL, LDLCALC, LDLDIRECT  Physical Exam:    VS:  BP 104/70  Pulse 78   Ht 5' 7 (1.702 m)   Wt 221 lb (100.2 kg)   SpO2 97%   BMI 34.61 kg/m     Wt Readings from Last 3 Encounters:  01/31/24 221 lb (100.2 kg)  07/10/23 235 lb 6.4 oz (106.8 kg)  06/27/23 234 lb (106.1 kg)     GEN:  Well nourished, well developed in no acute distress HEENT: Normal NECK: No JVD; No carotid bruits LYMPHATICS: No lymphadenopathy CARDIAC: RRR, no murmurs, no rubs, no gallops RESPIRATORY:  Clear to auscultation without rales, wheezing or rhonchi  ABDOMEN: Soft, non-tender, non-distended MUSCULOSKELETAL:  No edema; No deformity  SKIN: Warm and dry LOWER EXTREMITIES: no swelling NEUROLOGIC:  Alert and oriented x 3 PSYCHIATRIC:  Normal affect   ASSESSMENT:    1. Essential hypertension   2. Obstructive sleep apnea syndrome   3. Mixed hyperlipidemia    PLAN:    In order of problems listed above:  Essential hypertension blood pressure well-controlled continue present management he lost some weight anticipate in the future we will be able to cut down some medication for his hypertension because of blood pressure being actually low. Obstructive sleep apnea follow-up by internal medicine team and again the same story if you lose  some significant weight that may get better to the point that CPAP may not be needed. Mixed dyslipidemia with all life changes he made we will continue monitoring encouraged him to be active and exercise on a regular basis.   Medication Adjustments/Labs and Tests Ordered: Current medicines are reviewed at length with the patient today.  Concerns regarding medicines are outlined above.  No orders of the defined types were placed in this encounter.  Medication changes: No orders of the defined types were placed in this encounter.   Signed, Lamar DOROTHA Fitch, MD, [facility] 01/31/2024 9:10 AM    [facility]    [1]  Current Meds  Medication Sig   amLODipine (NORVASC) 5 MG tablet Take 5 mg by mouth daily.   celecoxib (CELEBREX) 200 MG capsule Take 200 mg by mouth daily.   lidocaine -prilocaine  (EMLA ) cream 1 gram tid prn   Oxycodone  HCl 10 MG TABS 1 tablet Orally every 6 hrs for 30 days   pantoprazole  (PROTONIX ) 40 MG tablet Take 40 mg by mouth daily.   pramipexole (MIRAPEX) 1 MG tablet Take 1 mg by mouth in the morning and at bedtime.   valsartan-hydrochlorothiazide  (DIOVAN-HCT) 160-12.5 MG tablet Take 1 tablet by mouth daily.   "

## 2024-01-31 NOTE — Patient Instructions (Signed)

## 2024-02-19 ENCOUNTER — Ambulatory Visit: Admitting: Cardiology
